# Patient Record
Sex: Female | Born: 1950
Health system: Southern US, Community
[De-identification: ages and names within clinical notes are randomized; demographics above are authoritative.]

## PROBLEM LIST (undated history)

## (undated) DIAGNOSIS — Z9889 Other specified postprocedural states: Secondary | ICD-10-CM

## (undated) DIAGNOSIS — F329 Major depressive disorder, single episode, unspecified: Secondary | ICD-10-CM

## (undated) DIAGNOSIS — F419 Anxiety disorder, unspecified: Secondary | ICD-10-CM

## (undated) DIAGNOSIS — M329 Systemic lupus erythematosus, unspecified: Secondary | ICD-10-CM

## (undated) DIAGNOSIS — K8020 Calculus of gallbladder without cholecystitis without obstruction: Secondary | ICD-10-CM

## (undated) DIAGNOSIS — T8859XA Other complications of anesthesia, initial encounter: Secondary | ICD-10-CM

## (undated) DIAGNOSIS — R112 Nausea with vomiting, unspecified: Secondary | ICD-10-CM

## (undated) DIAGNOSIS — T4145XA Adverse effect of unspecified anesthetic, initial encounter: Secondary | ICD-10-CM

## (undated) DIAGNOSIS — K219 Gastro-esophageal reflux disease without esophagitis: Secondary | ICD-10-CM

## (undated) DIAGNOSIS — IMO0002 Reserved for concepts with insufficient information to code with codable children: Secondary | ICD-10-CM

## (undated) DIAGNOSIS — M1990 Unspecified osteoarthritis, unspecified site: Secondary | ICD-10-CM

## (undated) DIAGNOSIS — D682 Hereditary deficiency of other clotting factors: Secondary | ICD-10-CM

## (undated) DIAGNOSIS — I10 Essential (primary) hypertension: Secondary | ICD-10-CM

## (undated) DIAGNOSIS — F32A Depression, unspecified: Secondary | ICD-10-CM

## (undated) DIAGNOSIS — I2699 Other pulmonary embolism without acute cor pulmonale: Secondary | ICD-10-CM

## (undated) DIAGNOSIS — C4922 Malignant neoplasm of connective and soft tissue of left lower limb, including hip: Secondary | ICD-10-CM

## (undated) HISTORY — DX: Hereditary deficiency of other clotting factors: D68.2

## (undated) HISTORY — DX: Other pulmonary embolism without acute cor pulmonale: I26.99

---
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
## 1982-11-16 HISTORY — PX: APPENDECTOMY: SHX54

## 1990-11-16 HISTORY — PX: CHOLECYSTECTOMY: SHX55

## 1999-05-14 ENCOUNTER — Other Ambulatory Visit: Admission: RE | Admit: 1999-05-14 | Discharge: 1999-05-14 | Payer: Self-pay | Admitting: *Deleted

## 2001-01-27 ENCOUNTER — Other Ambulatory Visit: Admission: RE | Admit: 2001-01-27 | Discharge: 2001-01-27 | Payer: Self-pay | Admitting: *Deleted

## 2001-04-13 ENCOUNTER — Encounter (INDEPENDENT_AMBULATORY_CARE_PROVIDER_SITE_OTHER): Payer: Self-pay

## 2001-04-13 ENCOUNTER — Observation Stay (HOSPITAL_COMMUNITY): Admission: RE | Admit: 2001-04-13 | Discharge: 2001-04-14 | Payer: Self-pay | Admitting: Obstetrics and Gynecology

## 2001-07-06 ENCOUNTER — Inpatient Hospital Stay (HOSPITAL_COMMUNITY): Admission: RE | Admit: 2001-07-06 | Discharge: 2001-07-08 | Payer: Self-pay | Admitting: Obstetrics and Gynecology

## 2001-07-06 ENCOUNTER — Encounter (INDEPENDENT_AMBULATORY_CARE_PROVIDER_SITE_OTHER): Payer: Self-pay

## 2001-07-06 HISTORY — PX: TOTAL ABDOMINAL HYSTERECTOMY: SHX209

## 2003-11-28 ENCOUNTER — Other Ambulatory Visit: Admission: RE | Admit: 2003-11-28 | Discharge: 2003-11-28 | Payer: Self-pay | Admitting: Obstetrics and Gynecology

## 2004-12-16 ENCOUNTER — Observation Stay (HOSPITAL_COMMUNITY): Admission: EM | Admit: 2004-12-16 | Discharge: 2004-12-17 | Payer: Self-pay | Admitting: Emergency Medicine

## 2005-07-09 ENCOUNTER — Encounter: Admission: RE | Admit: 2005-07-09 | Discharge: 2005-07-09 | Payer: Self-pay | Admitting: Family Medicine

## 2005-07-22 ENCOUNTER — Encounter: Admission: RE | Admit: 2005-07-22 | Discharge: 2005-07-22 | Payer: Self-pay | Admitting: Family Medicine

## 2005-09-30 ENCOUNTER — Encounter: Admission: RE | Admit: 2005-09-30 | Discharge: 2005-09-30 | Payer: Self-pay | Admitting: Family Medicine

## 2008-02-17 ENCOUNTER — Ambulatory Visit (HOSPITAL_COMMUNITY): Admission: RE | Admit: 2008-02-17 | Discharge: 2008-02-17 | Payer: Self-pay | Admitting: Family Medicine

## 2010-08-03 ENCOUNTER — Emergency Department (HOSPITAL_COMMUNITY): Admission: EM | Admit: 2010-08-03 | Discharge: 2010-08-04 | Payer: Self-pay | Admitting: Emergency Medicine

## 2010-08-04 ENCOUNTER — Encounter: Payer: Self-pay | Admitting: Internal Medicine

## 2010-08-05 ENCOUNTER — Ambulatory Visit: Payer: Self-pay | Admitting: Internal Medicine

## 2010-08-05 ENCOUNTER — Inpatient Hospital Stay (HOSPITAL_COMMUNITY): Admission: AD | Admit: 2010-08-05 | Discharge: 2010-08-07 | Payer: Self-pay | Admitting: Internal Medicine

## 2010-08-05 DIAGNOSIS — R042 Hemoptysis: Secondary | ICD-10-CM | POA: Insufficient documentation

## 2010-08-05 DIAGNOSIS — J4489 Other specified chronic obstructive pulmonary disease: Secondary | ICD-10-CM | POA: Insufficient documentation

## 2010-08-05 DIAGNOSIS — D682 Hereditary deficiency of other clotting factors: Secondary | ICD-10-CM | POA: Insufficient documentation

## 2010-08-05 DIAGNOSIS — J449 Chronic obstructive pulmonary disease, unspecified: Secondary | ICD-10-CM | POA: Insufficient documentation

## 2010-08-05 DIAGNOSIS — I2699 Other pulmonary embolism without acute cor pulmonale: Secondary | ICD-10-CM | POA: Insufficient documentation

## 2010-08-06 ENCOUNTER — Encounter: Payer: Self-pay | Admitting: Internal Medicine

## 2010-08-20 ENCOUNTER — Ambulatory Visit: Payer: Self-pay | Admitting: Internal Medicine

## 2010-08-20 DIAGNOSIS — F17200 Nicotine dependence, unspecified, uncomplicated: Secondary | ICD-10-CM | POA: Insufficient documentation

## 2010-12-16 NOTE — Assessment & Plan Note (Signed)
Summary: Hx and Physical Exam New onset hemoptysis   Visit Type:  Initial Consult Copy to:  Dr. Radford Pax Primary Provider/Referring Provider:  Dr. Cam Hai  CC:  Hemoptysis.  History of Present Illness: 61 yowf quit smoking Sept 18th with h/o PE age 60  while on BCP  took coumadin for a year with onset of hemoptysis.  August 05, 2010  1st pulmonary office eval cc cough x years worse in am typically clears p 15-20 min but no sob and no prev hemoptysis with no recent flare.   Abuptly on 9/18 developed cough 30 min p lying down coughed up a half a cup of blood in setting of bad sinus congestion x 2 weeks self rx with clariton went [facility] with cxr (ok) and labs ok.  Then 9/19 am started again this time blood pouring out nose but only while coughing.  Only sob when coughing.   Current Medications (verified): 1)  Prilosec Otc 20 Mg Tbec (Omeprazole Magnesium) .Marland Kitchen.. 1 Every Am 2)  Claritin-D 24 Hour 10-240 Mg Xr24h-Tab (Loratadine-Pseudoephedrine) .Marland Kitchen.. 1 Once Daily As Needed  Allergies (verified): 1)  ! 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  Smokes 1 1/2 ppd. No ETOH  Review of Systems       The patient complains of shortness of breath with activity, productive cough, coughing up blood, acid heartburn, indigestion, loss of appetite, nasal congestion/difficulty breathing through nose, sneezing, and joint stiffness or pain.  The patient denies shortness of breath at rest, non-productive cough, chest pain, irregular heartbeats, weight change, abdominal pain, difficulty swallowing, sore throat,  tooth/dental problems, headaches, itching, ear ache, anxiety, depression, hand/feet swelling, rash, change in color of mucus, and fever.    Vital Signs:  Patient profile:   60 year old female Height:      64 inches Weight:      178 pounds BMI:     30.66 O2 Sat:      99 % on Room air Temp:     98.1 degrees F oral Pulse rate:   107 / minute BP sitting:   126 / 60  (left arm)  Vitals Entered By: Vernie Murders (August 05, 2010 9:22 AM)  O2 Flow:  Room air   Physical Exam  Additional Exam:  amb wf nad wt 178 August 05, 2010 HEENT mild turbinate edema with crusting, scabs left turbinates but no active  bleeding. Oropharynx no thrush or excess pnd or cobblestoning.  No JVD or cervical adenopathy. Mild accessory muscle hypertrophy. Trachea midline, nl thryroid. Chest was hyperinflated by percussion with diminished breath sounds and moderate increased exp time without wheeze. Hoover sign positive at mid inspiration. Regular rate and rhythm without murmur gallop or rub or increase P2 or edema.  Abd: no hsm, nl excursion. Ext warm without cyanosis or clubbing.       Sodium (NA)                              142               135-145  mEq/L  Potassium (K)                            3.5               3.5-5.1          mEq/L  Chloride                                 107               96-112           mEq/L  CO2                                      28                19-32            mEq/L  Glucose                                  109        h      70-99            mg/dL  BUN                                      9                 6-23             mg/dL  Creatinine                               0.93              0.4-1.2          mg/dL  GFR, Est Non African American            >60               >60              mL/min  GFR, Est African American                >60               >60              mL/min    Oversized comment, see footnote  1  Calcium                                  9.0                8.4-10.5         mg/dL   CXR  Procedure date:  08/03/2010  Findings:      Mild chronic bronchitis changes only  Impression & Recommendations:  Problem # 1:  HEMOPTYSIS UNSPECIFIED (ICD-786.30)  ? origin needs expedient w/u  last aspirn was 9/16 am and strongly doubt pulmonary emboli on basis of absence    rec admit,  scan/ fob tomorrow  Discussed in detail all the  indications, usual  risks and alternatives  relative to the benefits with patient who agrees to proceed with bronchoscopy.   Problem # 2:  FACTOR V DEFICIENCY (ICD-286.3) Pos risk pulmonary emboli but this was while on BCPs   Problem # 3:  COPD UNSPECIFIED (ICD-496) Mild CB by hx with ? element of AB/ emphysema.  Will need pft's f/u  Medications Added to Medication List This Visit: 1)  Prilosec Otc 20 Mg Tbec (Omeprazole magnesium) .Marland Kitchen.. 1 every am 2)  Claritin-d 24 Hour 10-240 Mg Xr24h-tab (Loratadine-pseudoephedrine) .Marland Kitchen.. 1 once daily as needed  Other Orders: No Charge Patient Arrived (NCPA0) (NCPA0)

## 2010-12-16 NOTE — Assessment & Plan Note (Signed)
Summary: Pulmonary/ f/u hemoptysis   Copy to:  Dr. Radford Pax Primary Provider/Referring Provider:  Dr. Cam Hai  CC:  Post hospital follouwp.  Pt states that she feels well and denies any complaints.  No more hemoptysis.  Still smoking- down to less than 1 ppd..  History of Present Illness: 57 yowf quit smoking Sept 18th with h/o PE age 60  while on BCP  took coumadin for a year with  new onset of hemoptysis in Sept 2011  August 05, 2010  1st pulmonary office eval cc cough x years worse in am typically clears p 15-20 min but no sob and no prev hemoptysis with no recent flare.   Abuptly on 9/18 developed cough 30 min p lying down coughed up a half a cup of blood in setting of bad sinus congestion x 2 weeks self rx with clariton went [facility] with cxr (ok) and labs ok.  Then 9/19 am started again this time blood pouring out nose but only while coughing.  Only sob when coughing.  Ct chest, neck and sinus all neg and fob retained bloody mucus plus RML/ RLL  August 20, 2010 Post hospital follouwp.  Pt states that she feels well and denies any complaints.  No more hemoptysis.  Still smoking- down to less than 1 ppd. no sob.  Pt denies any significant sore throat, dysphagia, itching, sneezing,  nasal congestion or excess secretions,  fever, chills, sweats, unintended wt loss, pleuritic or exertional cp, hempoptysis, change in activity tolerance  orthopnea pnd or leg swelling   Current Medications (verified): 1)  Prilosec Otc 20 Mg Tbec (Omeprazole Magnesium) .Marland Kitchen.. 1 Every Am 2)  Claritin-D 24 Hour 10-240 Mg Xr24h-Tab (Loratadine-Pseudoephedrine) .Marland Kitchen.. 1 Once Daily As Needed  Allergies (verified): 1)  ! Pcn  Past History:  Past Medical History: HEMOPTYSIS UNSPECIFIED (ICD-786.30)     - new onset 08/04/10 > admit with  neg ct chest, neck and sinus, neg FOB  FACTOR V DEFICIENCY (ICD-286.3) Hx of PULMONARY EMBOLISM (ICD-415.19)  Family History: CVA father > F V Leiden  Pacemaker mother HBP  mother Brother and sister healthy No cancer  Vital Signs:  Patient profile:   60 year old female Weight:      183.13 pounds O2 Sat:      98 % on Room air Temp:     97.7 degrees F oral Pulse rate:   89 / minute BP sitting:   112 / 68  (left arm)  Vitals Entered By: Vernie Murders (August 20, 2010 2:50 PM)  O2 Flow:  Room air   Impression & Recommendations:  Problem # 1:  HEMOPTYSIS UNSPECIFIED (ICD-786.30)  resolved with no specific dx so likely related to smoker's bronchitis  Orders: Est. Patient Level III ([address])  Problem # 2:  SMOKER (ICD-305.1)  Her updated medication list for this problem includes:    Chantix Starting Month Pak 0.5 Mg X 11 & 1 Mg X 42 Misc (Varenicline tartrate) .Marland Kitchen... Take as directed---refills are to be for the continuing pak  Discussed,  considering committing to quit.  Did have luck with chantix previously so ok to restart.  Orders: Est. Patient Level III (60454)  Medications Added to Medication List This Visit: 1)  Chantix Starting Month Pak 0.5 Mg X 11 & 1 Mg X 42 Misc (Varenicline tartrate) .... Take as directed---refills are to be for the continuing pak  Patient Instructions: 1)  Return to office in 3 months, sooner if needed for cxr and  pft's but call sooner if any more coughing up blood 2)  In meantime make every effort to stop smoking before it stops you Prescriptions: CHANTIX STARTING MONTH PAK 0.5 MG X 11 & 1 MG X 42  MISC (VARENICLINE TARTRATE) Take as directed---Refills are to be for the continuing pak  #1 x 0   Entered and Authorized by:   Nyoka Cowden MD   Signed by:   Nyoka Cowden MD on 08/20/2010   Method used:   Electronically to        [facility]  [phone]* (retail)       [address]       [place], [place]  [address]       Ph: [phone] or [phone]       Fax: [phone]   RxID:   [id number]

## 2011-01-29 LAB — CBC
HCT: 39.8 % (ref 36.0–46.0)
HCT: 42.3 % (ref 36.0–46.0)
Hemoglobin: 13.8 g/dL (ref 12.0–15.0)
Hemoglobin: 14.7 g/dL (ref 12.0–15.0)
MCH: 31.5 pg (ref 26.0–34.0)
MCH: 31.7 pg (ref 26.0–34.0)
MCHC: 34.7 g/dL (ref 30.0–36.0)
MCHC: 34.7 g/dL (ref 30.0–36.0)
MCV: 90.8 fL (ref 78.0–100.0)
MCV: 91.4 fL (ref 78.0–100.0)
Platelets: 322 10*3/uL (ref 150–400)
Platelets: 372 10*3/uL (ref 150–400)
RBC: 4.39 MIL/uL (ref 3.87–5.11)
RBC: 4.64 MIL/uL (ref 3.87–5.11)
RDW: 13.1 % (ref 11.5–15.5)
RDW: 13.1 % (ref 11.5–15.5)
WBC: 8.6 10*3/uL (ref 4.0–10.5)
WBC: 9.3 10*3/uL (ref 4.0–10.5)

## 2011-01-29 LAB — AFB CULTURE WITH SMEAR (NOT AT ARMC): Acid Fast Smear: NONE SEEN

## 2011-01-29 LAB — DIFFERENTIAL
Basophils Absolute: 0.1 10*3/uL (ref 0.0–0.1)
Basophils Relative: 1 % (ref 0–1)
Eosinophils Absolute: 0.1 10*3/uL (ref 0.0–0.7)
Eosinophils Relative: 1 % (ref 0–5)
Lymphocytes Relative: 44 % (ref 12–46)
Lymphs Abs: 3.7 10*3/uL (ref 0.7–4.0)
Monocytes Absolute: 0.5 10*3/uL (ref 0.1–1.0)
Monocytes Relative: 6 % (ref 3–12)
Neutro Abs: 4.1 10*3/uL (ref 1.7–7.7)
Neutrophils Relative %: 48 % (ref 43–77)

## 2011-01-29 LAB — BASIC METABOLIC PANEL
BUN: 9 mg/dL (ref 6–23)
CO2: 28 mEq/L (ref 19–32)
Calcium: 9 mg/dL (ref 8.4–10.5)
Chloride: 107 mEq/L (ref 96–112)
Creatinine, Ser: 0.93 mg/dL (ref 0.4–1.2)
GFR calc Af Amer: >60 mL/min (ref >60)
GFR calc non Af Amer: >60 mL/min (ref >60)
Glucose, Bld: 109 mg/dL — ABNORMAL HIGH (ref 70–99)
Potassium: 3.5 mEq/L (ref 3.5–5.1)
Sodium: 142 mEq/L (ref 135–145)

## 2011-01-29 LAB — TYPE AND SCREEN
ABO/RH(D): A POS
Antibody Screen: NEGATIVE

## 2011-01-29 LAB — PROTIME-INR
INR: 0.88 (ref 0.00–1.49)
Prothrombin Time: 12.1 seconds (ref 11.6–15.2)

## 2011-01-29 LAB — ABO/RH: ABO/RH(D): A POS

## 2011-01-29 LAB — SEDIMENTATION RATE: Sed Rate: 22 mm/hr (ref 0–22)

## 2011-02-04 ENCOUNTER — Other Ambulatory Visit: Payer: Self-pay | Admitting: Internal Medicine

## 2011-02-06 NOTE — Telephone Encounter (Signed)
Records indicate needs ov with cxr then we can discuss the chantix

## 2011-02-12 ENCOUNTER — Other Ambulatory Visit: Payer: Self-pay | Admitting: *Deleted

## 2011-02-12 NOTE — Telephone Encounter (Signed)
LMTCB - need to get her on the sched.

## 2011-02-12 NOTE — Telephone Encounter (Signed)
Rax received from the pharmacy requesting a refill for Chantix on this patient. RX written 12/12/2010. Last filled 01/08/2011. Patient last seen on 10/5/201 and has no pending appts. Pls advise if okay to send refill.

## 2011-02-12 NOTE — Telephone Encounter (Signed)
Ok but needs ov here before the month runs out to regroup, or she can see her primary for this

## 2011-03-16 NOTE — Telephone Encounter (Signed)
lmomtcb x1 

## 2011-03-19 ENCOUNTER — Encounter: Payer: Self-pay | Admitting: *Deleted

## 2011-03-19 NOTE — Telephone Encounter (Signed)
Lmomtcb. Will send letter asking that she call our office for an appointment.

## 2011-04-02 ENCOUNTER — Encounter: Payer: Self-pay | Admitting: Internal Medicine

## 2011-04-03 NOTE — H&P (Signed)
[facility]  Patient:    Brittany Jordan, Brittany Jordan Visit Number: [id number] MRN: [id number]          Service Type: GYN Location: [id number] Attending Physician:  Miguel Aschoff Dictated by:   Miguel Aschoff, M.D. Adm. Date:  07/06/2001                           History and Physical  ADMISSION DIAGNOSIS:          Chronic pelvic pain, dyspareunia.  HISTORY:                      The patient is a 60 year old white female gravida 0 para 0 with a long history of pelvic pain and dyspareunia that has been refractory to medical therapy.  The patient ultimately underwent a laparoscopy and a left salpingo-oophorectomy in May 2002 for a cystic mass that proved to be a theca lutein cyst.  Following this procedure the patient continued to have dyspareunia and pelvic pain, and at this point on pelvic exam the pain is duplicated by the motion of the cervix and uterus.  It was felt that at this point the only measures left to resolve the pain would be a hysterectomy and the patient was desirous to have this problem corrected, and is now being admitted to undergo total abdominal hysterectomy and right salpingo-oophorectomy.  Patients menstrual cycles are still regular.  She denies any history of dysuria, frequency, or urgency.  There is no history of nausea, vomiting, diarrhea, melena, or hematochezia.  PAST MEDICAL HISTORY:         Positive for history of factor V Leiden. Patient had a pulmonary embolus in 1976.  Ear surgery in 1980.  Appendectomy in 1970.  ALLERGIC HISTORY:             Positive for allergies to PENICILLIN.  The patient has nausea and vomiting when taking CODEINE.  REVIEW OF SYSTEMS:            HEENT:  Negative.  CARDIORESPIRATORY:  Negative for shortness of breath, cough or hemoptysis.  GI:  Negative - see present illness.  GU:  Negative.  GYN:  See present illness.  NEUROMUSCULAR: Negative.  PHYSICAL EXAMINATION:  GENERAL:                      The patient  is a well-developed, well-nourished, thin white female in no acute distress.  VITAL SIGNS:                  Temperature 97.3, pulse 89, respirations 16, blood pressure 96/60.  HEENT:                        Head is normocephalic and atraumatic.  Eyes show pupils to be reactive to light and accommodation.  Sclerae are white, conjunctivae are pink.  Extraocular muscles have full range of motion.  Nose patent without gross lesions.  Tongue is moist and midline and throat is clear.  NECK:                         Supple.  There is no adenopathy or jugular venous distention noted.  No thyromegaly is noted.  HEART:                        Reveals a regular rhythm  with no murmur or gallop.  BREASTS:                      Nontender.  No masses are found.  ABDOMEN:                      Soft, nontender.  There is no evidence of enlargement of the liver, kidneys, or spleen.  Bowel sounds are active.  There is a well-healed incision in the right lower quadrant and well-healed laparoscopy incisions in the lower abdomen.  BACK:                         Reveal no CVA tenderness or deformity.  PELVIC:                       Reveals normal external genitalia, normal Bartholins and Skenes glands.  Normal urethra.  Vaginal vault is without gross lesions.  Cervix is without gross lesion.  Uterus anterior, normal size, but is tender to touch and motion duplicating patients dyspareunia.  The adnexa reveal no definite masses.  Rectovaginal exam is confirmatory.  EXTREMITIES:                  Reveal no cyanosis, clubbing, or edema.  SKIN:                         Without gross lesion.  NEUROLOGIC:                   Intact.  IMPRESSION:                   Chronic pelvic pain, dyspareunia.  PLAN:                         The patient to undergo total abdominal hysterectomy and right salpingo-oophorectomy. Dictated by:   Miguel Aschoff, M.D. Attending Physician:  Miguel Aschoff DD:  07/06/01 TD:  07/06/01 Job:  [id number]

## 2011-04-03 NOTE — Discharge Summary (Signed)
[facility]  Patient:    Brittany Jordan, Brittany Jordan                          MRN: [id number] Adm. Date:  [id number] Disc. Date: [id number] Attending:  Miguel Aschoff                           Discharge Summary  ADMISSION DIAGNOSES:          1. Chronic left lower quadrant pain.                               2. Dyspareunia.  FINAL DIAGNOSES:              1. Pelvic endometriosis.                               2. Theca-lutein cyst of left ovary.  OPERATIONS AND PROCEDURES:    1. Laparoscopy with left salpingo-oophorectomy.                               2. Laser uterosacral nerve ablation.  BRIEF HISTORY:                The patient is a 60 year old white female with a prior history of endometriosis and persistent left lower quadrant pain and dyspareunia. On ultrasound she was noted to have a complex left adnexal mass. Because of the persistent symptomatology associated with this mass, she has requested that a definitive procedure be carried out to establish the etiology of the pain and correct it. She was therefore admitted to the hospital to undergo laparoscopy and laparotomy, if indicated.  HOSPITAL COURSE:              Preoperative studies were obtained. This included an admission hemoglobin of 14.9, hematocrit 44, chemistry profile was normal, urinalysis was negative. Under general anesthesia, diagnostic laparoscopy was carried out. A complex cyst of the left ovary was identified and laparoscopic left salpingo-oophorectomy was carried out without difficulty. In addition, in view of her prior history of endometriosis, laser uterosacral nerve ablation was also carried out. The patient was observed in the hospital for one day and was in satisfactory condition and felt stable enough to be discharged home in the morning of Apr 14, 2001.  DISCHARGE MEDICATIONS:        1. Tylox, one every three hours as needed for                                  pain.          2. Phenergan 25 mg p.o. q.4h. for nausea.  DISCHARGE FOLLOWUP:           She will be seen back in four weeks for followup examination.  DISCHARGE INSTRUCTIONS:       She was instructed to place nothing in the vagina and to call if there are any problems such as fever, pain, or heavy bleeding.  DD:  05/04/01 TD:  05/04/01 Job: [id number]

## 2011-04-03 NOTE — Op Note (Signed)
[facility]  Patient:    Brittany Jordan, Brittany Jordan Visit Number: [id number] MRN: [id number]          Service Type: GYN Location: 9300 [id number] Attending Physician:  Miguel Aschoff Proc. Date: 07/06/01 Adm. Date:  07/06/2001                             Operative Report  PREOPERATIVE DIAGNOSIS:       Chronic pelvic pain with dyspareunia.  POSTOPERATIVE DIAGNOSIS:      Chronic pelvic pain with dyspareunia.  OPERATION:                    1. Total abdominal hysterectomy.                               2. Right salpingo-oophorectomy.  SURGEON:                      Miguel Aschoff, M.D.  ASSISTANT:                    Luvenia Redden, M.D.  ANESTHESIA:                   General.  COMPLICATIONS:                None.  JUSTIFICATION:                The patient is a 60 year old white female with a long history of persistent chronic pelvic pain which was mainly on the left side but did not respond to left salpingo-oophorectomy.  She presents now to undergo definitive surgery with total abdominal hysterectomy and right salpingo-oophorectomy in effort to correct the pelvic pain and dyspareunia. Risks and benefits have been discussed with the patient.  Informed consent has been obtained.  DESCRIPTION OF PROCEDURE:     The patient was taken to the operating room, placed in the sitting position, and general anesthesia was administered without difficulty.  She was then placed in a modified lithotomy position and prepped and draped in the usual sterile fashion.  Pfannenstiel incision was made, extended down through the subcutaneous tissue with bleeding points being clamped and coagulated as they were encountered.  The fascia was then identified and incised transversely and separated from the underlying rectus muscles.  The rectus muscles were divided in the midline, and the peritoneum was then identified and entered carefully, avoiding underlying structures. The peritoneal incision  was extended under direct visualization.  At this point, a self-retaining retractor was placed through the wound, the viscera packed out of the pelvis.  Inspection revealed the uterus to be actually small in size but with bulbous cervix and thickened uterosacral ligaments.  The left tube and ovary were noted to be absent.  There were adhesions of the rectosigmoid on the left cornual portion of the ureters.  The right ovary was within normal limits.  There were some adhesions holding the right tube to the pelvic sidewall.  These adhesions were taken down without difficulty.  At this point, the round ligaments were identified, clamped, cut, and suture ligated using suture ligatures of 0 Vicryl.  The bladder flap was then created anteriorly.  Perforation was then made below the utero-ovarian ligament.  This ligament was then clamped, cut, and suture ligated using suture ligatures of O Vicryl.  The broad ligament  was skeletonized.  The uterine vessels were identified, clamped with curve Heaney clamps, and all pedicles were cut and suture ligated using suture ligatures of 0 Vicryl.  Using straight Heaney clamps, the paracervical fascia was clamped bilaterally, cut, and suture ligated using suture ligatures of 0 Vicryl.  Then the cardinal ligaments and uterosacral ligaments were clamped, cut, and suture ligated in a similar fashion.  It was then possible to place curved Heaney clamps across the reflection of the vagina to the cervix, and the specimen was then cut free. The angles of the vaginal cuff were then transfixed using figure-of-eight sutures of 0 Vicryl with incorporation of the uterosacral ligaments for support.  The remainder of the vaginal cuff was closed using interrupted 0 Vicryl sutures.  Attention was then directed to the right ovary.  A perforation was then made in the peritoneum between the infundibulopelvic ligament and round ligament. The ureter was then identified.  With the  ureter being out of the field, the infundibulopelvic ligament was clamped, cut, and doubly ligated using two ligatures of 0 Vicryl.  Using a Heaney clamp, the residual pedicle holding the ovary and the tube was clamped, cut, and this pedicle was suture ligated using suture ligature of 0 Vicryl.  The pelvic floor was then reperitonealized using running continuous 2-0 Vicryl suture.  The abdomen was then irrigated with warm saline.  Inspection was made for hemostasis.  Hemostasis appeared to be excellent, and then the abdomen was closed.  Instrument counts were taken and found to be correct as well as lap counts.  The parietal peritoneum was closed using running continuous 0 Vicryl suture.  The rectus muscles were reapproximated using running continuous 0 Vicryl suture.  The fascia was then closed using two sutures of 0 Vicryl, each starting at the lateral fascial angles and meeting at the midline.  Subcutaneous tissue and skin were closed using staples.  The estimated blood loss was approximately 100 cc.  The patient tolerated the procedure well and went to the recovery room in satisfactory condition. Attending Physician:  Miguel Aschoff DD:  07/06/01 TD:  07/06/01 Job: [id number]

## 2011-04-03 NOTE — Discharge Summary (Signed)
NAME:  Brittany Jordan, Brittany Jordan                 ACCOUNT NO.:  [id number]   MEDICAL RECORD NO.:  [id number]          PATIENT TYPE:  INP   LOCATION:  3703                         FACILITY:  [facility]   PHYSICIAN:  Corinna L. Lendell Caprice, MDDATE OF BIRTH:  12/11/1950   DATE OF ADMISSION:  12/16/2004  DATE OF DISCHARGE:  12/17/2004                                 DISCHARGE SUMMARY   DIAGNOSES:  1.  Chest pain, possibly gastroesophageal reflux disease.  2. Myocardial      infarction ruled out.  3. Pulmonary embolus ruled out.  4. Discoid      lupus.  5. Tobacco abuse, counseled against.  6. Restless leg syndrome.      7. Anxiety disorder.   DISCHARGE MEDICATIONS:  1.  Aspirin 81 mg a day.  She is to continue her other outpatient      medications.  2. Effexor XR 150 mg daily.  3. Plaquenil 200 mg p.o.      b.i.d.  4. Flexeril 5 mg p.o. q.h.s.  5. Estrogen patch twice weekly.      6. Aciphex daily.   PROCEDURE:  None.   CONSULTATIONS:  None.   ACTIVITY:  Ad lib.   CONDITION:  Stable.   PERTINENT LABORATORY:  CBC, BMET and serial cardiac enzymes negative.   SPECIAL STUDIES AND RADIOLOGY:  EKG showed normal sinus rhythm.  Cannot rule  out age indeterminate anteroseptal infarct.  Cat scan of the chest showed no  evidence of pulmonary embolus.  It did show emphysema and chronic  bronchitis.   HOSPITAL COURSE:  Brittany Jordan is a 60 year old white female who presented with  several day history of chest pressure.  It also was accompanied by  occasional sharp chest pain.  It did not change with inspiration or  movement.  It did, however, worsen with exertion.  The patient had been seen  the day prior and started on Aciphex for this problem, but the pain did not  go away and the patient presented to the emergency room.  She had a normal  exam and normal labs, and EKG that showed no acute changes.  The patient has  a history of factor V Leiden and previous pulmonary embolus and so a cat  scan of the chest was  done.  This ruled out pulmonary embolism.  The patient  had thought that this may be somewhat stress related as she is under a lot  of stress from her job and is currently looking for another job because of  this reason.  The patient was admitted to telemetry and ruled out for  myocardial infarction.  She received aspirin.  She received Toradol,  nitroglycerin sublingually and a GI cocktail; none of which helped  tremendously.  However, the day of discharge, she had no further chest pain.  The Cardiolite could not be scheduled because the patient had already eaten,  however, due to the atypical nature of this pain, she can safely be  discharged to home with an outpatient stress test.  I have set this up with  [facility] and they will send  the results to Dr. __________, who can do any  further workup if needed.   Total time on the day of discharge is 40 minutes.      CLS/MEDQ  D:  12/17/2004  T:  12/17/2004  Job:  [id number]   cc:   Francisca December, M.D.  [address]  [address]  [place]  [place] [address]  Fax: [phone]

## 2011-04-03 NOTE — Op Note (Signed)
[facility]  Patient:    Brittany Jordan, Brittany Jordan                          MRN: [id number] Proc. Date: 04/13/01 Attending:  Miguel Aschoff, M.D.                           Operative Report  PREOPERATIVE DIAGNOSIS:       Chronic pelvic pain, dyspareunia.  POSTOPERATIVE DIAGNOSIS:      Pelvic endometriosis with endometriomas of left ovary.  OPERATION:                    Diagnostic laparoscopy with laparoscopic salpingo-oophorectomy.  Laser ablation of uterosacral nerves.  SURGEON:                      Miguel Aschoff, M.D.  ASSISTANT:                    Marcelle Overlie, M.D.  ANESTHESIA:                   General anesthesia.  COMPLICATIONS:                None.  ESTIMATED BLOOD LOSS:  INDICATIONS:                  The patient is a 60 year old white female with a persistent history of left lower quadrant pain and dyspareunia as well as prior history of endometriosis.  The pain had become intolerable and ultrasound examination was carried out and revealed the presence of a complex cystic mass in the left adnexa.  Because of this mass and the patients pain, she is being taken to the operating room to undergo laparoscopy and possible laparotomy with hysterectomy if indicated.  The risks and benefits of the procedure have been discussed with the patient.  DESCRIPTION OF PROCEDURE:     The patient was taken to the operating room, placed in the supine position, and general anesthesia was administered without difficulty.  She was then placed in the dorsal lithotomy position and prepped and draped in the usual sterile fashion.  The bladder was catheterized and then a Hulka tenaculum was placed through the cervix and held.  Attention was then directed to the umbilicus where a small infraumbilical incision was made, the Veress needle was then inserted, and the abdomen was insufflated with 3 liters of CO2.  Once this was done, the trocar to the laparoscope was placed followed  by the laparoscope itself.  Then under direct visualization, two additional ports were established.  The 5 mm suprapubic port and a 12 mm left lower quadrant port.  Systematic inspection of the pelvic organs showed the uterus to be anterior.  There was a small posterior 1 cm fibroid noted on the left cornual portion of the uterus.  The uterus was otherwise unremarkable. The tubes were traced out to their fimbriated ends.  The fimbria were fine and delicate.  The tubes were normal along their course.  The ovaries were inspected.  The right ovary was noted to have what appeared to be a follicular cyst present and was otherwise free of lesions and there were no adhesions noted on the right side.  On the left side, the ovary had what appeared to be two small endometriomas present approximately 2 cm in size each.  The intestinal surfaces appeared  to be unremarkable.  The cul-de-sac was inspected and there were several very small implants of endometriosis noted between the uterosacral ligaments.  At this point, bipolar cautery forceps were introduced and the utero-ovarian ligament and fallopian tube on the left side were coagulated and cut and then a pedicle was developed using the mesosalpinx and the mesoovarian.  These ligaments were coagulated and cut with scissors until it was possible to get to the base of the ovary and the infundibulopelvic ligament.  The ovary and tube were then elevated and endoloops were passed through the 12 mm port and the pedicle was doubly ligated without difficulty. The specimen was cut free, the specimen consisting of the left tube and ovary. There appeared to be excellent hemostasis on the left side and this area was irrigated and there was no evidence of any bleeding from the pedicles.  The intestinal surfaces were all maintained without difficulty.  In an effort to try to improve the patients dyspareunia, the YAG laser was used and the uterosacral ligaments were  partially transected using the YAG laser with care to avoid any injury to the ureter or any adjacent structures.  This was also done without difficulty.  Once this was done, an endocatch was placed through the 12 mm port and the specimen was placed in the bag and this was then removed through the left lower quadrant 12 mm incision.  Final inspection was made for hemostasis.  Hemostasis appeared to be excellent and with no other abnormalities being noted, the procedure was completed.  The CO2 was allowed to escape.  All instruments were removed and then the incisions of the umbilicus and the 5 mm port were closed using subcuticular 4-0 Vicryl.  The 12 mm port was closed by identifying the fascia and placing a figure-of-eight suture of 0 Vicryl through the fascia to close this defect.  Then a subcutaneous suture of 0 Vicryl was used and then Steri-Strips were applied to all of the sites.  The estimated blood loss was approximately 30 cc.  The patient tolerated the procedure well and was taken to the recovery room in satisfactory condition.  The plan is for the patient to spend the night.  She will be placed on analgesics.  The patient has a history of pulmonary embolus with factor V leiden.  She will receive prophylactic heparin therapy. DD:  04/13/01 TD:  04/13/01 Job: [id number]

## 2011-04-03 NOTE — H&P (Signed)
NAME:  Brittany Jordan, Brittany Jordan                 ACCOUNT NO.:  [id number]   MEDICAL RECORD NO.:  [id number]          PATIENT TYPE:  EMS   LOCATION:  MAJO                         FACILITY:  [facility]   PHYSICIAN:  Corinna L. Lendell Caprice, MDDATE OF BIRTH:  [birth year]   DATE OF ADMISSION:  12/16/2004  DATE OF DISCHARGE:                                HISTORY & PHYSICAL   CHIEF COMPLAINT:  Chest pain.   HPI:  Brittany Jordan is a 60 year old white female with substernal chest pain  since yesterday.  She saw Dr. Arvilla Market in the office yesterday and was told  that it might be her gastrointestinal tract.  She was given Aciphex and sent  home.  The pain initially was sharp yesterday.  This morning she woke up and  it felt worse, it feels pressure-like with periods of sharpness.  She has  some mild shortness of breath with it, she has had no cough, no nausea, no  dizziness.  The pain has been constant.  She has no history of coronary  artery disease.  She has a history of pulmonary embolus in her 23s and she  has Factor V Lyden.   PAST MEDICAL HISTORY:  1.  History of pulmonary embolus with Factor V Lyden.  2.  Discoid lupus.  3.  Restless leg syndrome.  4.  Possible gastroesophageal reflux disease.  5.  Anxiety.  6.  Tobacco use.   MEDICATIONS:  1.  Lorapatch 0.025 q.Monday and Thursday.  2.  Flexeril 5 mg p.o. q.h.s..  3.  Effexor XR 150 mg p.o. daily.  4.  Plaquenil 200 mg p.o. b.i.d.  5.  Aciphex was started yesterday 20 mg daily.   ALLERGIES:  She is ALLERGIC TO PENICILLIN WHICH CAUSES A RASH.   FAMILY HISTORY:  Her father died of a stroke, he had rheumatoid arthritis.  Her mother is alive and healthy.   SOCIAL HISTORY:  Patient is married.  She describes her job as extremely  stressful.  She smokes a pack of cigarettes a day.  She does not drink or  use drugs.   REVIEW OF SYSTEMS:  As above, otherwise negative.   PHYSICAL EXAMINATION:  Temperature is 96.9, blood pressure 128/89, pulse  111,  respiratory rate 20, oxygen saturation 99% on room air.  In general,  patient is well-nourished, well-developed, in no acute distress.  HEENT:  Normocephalic, atraumatic.  Pupils equal, round, reactive to light.  Sclera nonicteric.  Moist mucous membranes.  NECK:  Supple, no lymphadenopathy, no carotid bruits, no thyromegaly.  LUNGS:  Clear to auscultation bilaterally without wheezes, rhonchi or rales.  CARDIOVASCULAR:  Regular rate, rhythm without murmur, gallops, rubs.  ABDOMEN:  Normal bowel sounds, soft, nontender, nondistended.  GU AND RECTAL:  Deferred.  EXTREMITIES:  No clubbing, cyanosis or edema.  No calf tenderness.  Homan's  sign negative.  SKIN:  No rash.  PSYCHIATRIC:  Normal affect.   LABS:  Her first set of point of care enzymes is negative.  Basic metabolic  panel unremarkable.   EKG shows normal sinus rhythm, cannot rule out age indeterminate  anteroseptal  infarct.  Chest x-ray negative.   ASSESSMENT AND PLAN:  1.  Chest pain.  Within the differential, of course, is pulmonary embolus as      patient has a history of hypercoagulability and previous pulmonary      embolus.  I will get a CT of the chest to evaluate this further.      Cardiac source is less likely.  She has had chest pain continuously      since yesterday and if this were cardiac she should have bumped her      enzymes.  I will, however, get two more sets of cardiac enzymes and try      a sublingual nitroglycerin.  2.  Possible gastroesophageal reflux disease.  I will continue proton pump      inhibitor.  3.  Discoid lupus.  4.  Anxiety.  5.  Restless leg syndrome.  6.  Tobacco abuse, counseled against.      CLS/MEDQ  D:  12/16/2004  T:  12/16/2004  Job:  [id number]   cc:   Donia Guiles, M.D.  [address] [place]  [place] [address]  Fax: [phone]

## 2011-04-03 NOTE — Discharge Summary (Signed)
[facility]  Patient:    Brittany Jordan, DRIVER Visit Number: [id number] MRN: [id number]          Service Type: GYN Location: [id number] Attending Physician:  Miguel Aschoff Dictated by:   Miguel Aschoff, M.D. Admit Date:  07/06/2001 Discharge Date: 07/08/2001                             Discharge Summary  ADMISSION DIAGNOSIS:          Chronic pelvic pain.  FINAL DIAGNOSES:              1. Myoma.                               2. Endosalpingiosis.                               3. Pelvic adhesions.                               4. Suspected endometriosis.  OPERATIONS AND PROCEDURES:    1. Total abdominal hysterectomy.                               2. Right salpingo-oophorectomy.                               3. General anesthesia.  BRIEF HISTORY:                The patient is a 60 year old white female with a history of persistent chronic pelvic pain associated with left lower quadrant pain and severe dyspareunia. The patient underwent a prior laparoscopic oophorectomy, however, this did not result in any significant improvement of her pain, and she presents now to undergo expectant therapy via total abdominal hysterectomy and right salpingo-oophorectomy.  LABORATORY DATA:              Preoperative studies were obtained. This revealed admission hemoglobin of 15.1, white count 5700. Chemistry profile revealed slight elevation of the total protein to 8.3. The remainder of the values were normal. Urinalysis was negative. The patient was noted also to have factor V Leiden.  HOSPITAL COURSE:              Under general anesthesia on August 21, the patient underwent total abdominal hysterectomy and right salpingo-oophorectomy without difficulty. She was noted to have marked thickening of the uterosacral ligaments, again, which appeared to be consistent with endometriosis. She tolerated the surgical procedure well and an essentially uncomplicated postoperative  course. The patient was in satisfactory condition and by July 08, 2001 could be discharged home.  DISCHARGE MEDICATIONS:        1. Tylox one every three hours as needed for                                  pain.                               2. Phenergan one every six hours as needed for  nausea.                               3. Estradiol 1 mg daily.  DISCHARGE FOLLOWUP:           The patient was instructed to return to the office in four weeks for a followup examination, to do no heavy lifting, place nothing in the vagina, and to call for any problems such as fever, pain, or heavy bleeding.  FINAL PATHOLOGY REPORT:       Specimen revealed a 44 g uterus. There was an intramural leiomyoma, proliferative endometrioma, serosal fibrous adhesions, and endosalpingiosis noted as well as a benign hemorrhagic cyst in the right ovary. Dictated by:   Miguel Aschoff, M.D. Attending Physician:  Miguel Aschoff DD:  07/27/01 TD:  07/27/01 Job: [id number]

## 2011-04-08 ENCOUNTER — Encounter: Payer: Self-pay | Admitting: Internal Medicine

## 2011-04-08 ENCOUNTER — Ambulatory Visit (INDEPENDENT_AMBULATORY_CARE_PROVIDER_SITE_OTHER): Payer: BC Managed Care – PPO | Admitting: Internal Medicine

## 2011-04-08 VITALS — BP 116/78 | HR 95 | Temp 97.7°F | Ht 63.0 in | Wt 194.0 lb

## 2011-04-08 DIAGNOSIS — F17200 Nicotine dependence, unspecified, uncomplicated: Secondary | ICD-10-CM

## 2011-04-08 DIAGNOSIS — J449 Chronic obstructive pulmonary disease, unspecified: Secondary | ICD-10-CM

## 2011-04-08 DIAGNOSIS — R042 Hemoptysis: Secondary | ICD-10-CM

## 2011-04-08 DIAGNOSIS — I2699 Other pulmonary embolism without acute cor pulmonale: Secondary | ICD-10-CM

## 2011-04-08 MED ORDER — VARENICLINE TARTRATE 0.5 MG X 11 & 1 MG X 42 PO MISC: ORAL | Status: AC

## 2011-04-08 NOTE — Patient Instructions (Signed)
Stop smoking before it stops you  Chantix starter pack has been called in and you will need to see Dr Clelia Croft for refills - if you still have cravings after 3 months it's ok to continue it in whatever dose suppresses the urge to smoke   If you are satisfied with your treatment plan let your doctor know and he/she can either refill your medications or you can return here when your prescription runs out.     If in any way you are not 100% satisfied,  please tell us.  If 100% better, tell your friends!

## 2011-04-08 NOTE — Progress Notes (Signed)
Subjective:     Patient ID: Brittany Jordan, female   DOB: [birth year], 60 y.o.   MRN: [id number]  HPI  60 yowf active smoker  with h/o PE age 60 while on BCP took coumadin for a year with new onset of hemoptysis in Sept 2011   August 05, 2010 1st pulmonary office eval cc cough x years worse in am typically clears p 15-20 min but no sob and no prev hemoptysis with no recent flare. Abuptly on 9/18 developed cough 30 min p lying down coughed up a half a cup of blood in setting of bad sinus congestion x 2 weeks self rx with clariton went [facility] with cxr (ok) and labs ok. Then 9/19 am started again this time blood pouring out nose but only while coughing. Only sob when coughing. Ct chest, neck and sinus all neg and fob retained bloody mucus plus RML/ RLL   August 20, 2010 ov/ pcc feels well and denies any complaints. No more hemoptysis. Still smoking- down to less than 1 ppd. no sob.  rec stop smoking > transient success then p 3 weeks off chantix resumed   04/08/2011 ov/Brittany Jordan cc cough each am x up to an hour, Sleeping ok without nocturnal  or early am exac of resp c/o's and no problem with activity/ sob although relatively inactive.  Pt denies any significant sore throat, dysphagia, itching, sneezing,  nasal congestion or excess/ purulent secretions,  fever, chills, sweats, unintended wt loss, pleuritic or exertional cp, hempoptysis, orthopnea pnd or leg swelling.    Also denies any obvious fluctuation of symptoms with weather or environmental changes or other aggravating or alleviating factors.     Allergies   1) ! Pcn      Past Medical History:  HEMOPTYSIS UNSPECIFIED (ICD-786.30)  - new onset 08/04/10 > admit with neg ct chest, neck and sinus, neg FOB  FACTOR V DEFICIENCY (ICD-286.3)  Hx of PULMONARY EMBOLISM (ICD-415.19)   Family History:  CVA father > F V Leiden  Pacemaker mother  HBP mother  Brother and sister healthy  No cancer           Review of Systems     Objective:   Physical Exam    04/08/2011  wt amb wf nad  HEENT mild turbinate edema.  Oropharynx no thrush or excess pnd or cobblestoning.  No JVD or cervical adenopathy. Mild accessory muscle hypertrophy. Trachea midline, nl thryroid. Chest was hyperinflated by percussion with diminished breath sounds and moderate increased exp time without wheeze. Hoover sign positive at mid inspiration. Regular rate and rhythm without murmur gallop or rub or increase P2 or edema.  Abd: no hsm, nl excursion. Ext warm without cyanosis or clubbing.    Assessment:          Plan:

## 2011-04-08 NOTE — Assessment & Plan Note (Signed)
She is aware of increased risk recurrence

## 2011-04-08 NOTE — Assessment & Plan Note (Signed)
No recurrence of hemoptysis or epistaxis

## 2011-04-08 NOTE — Assessment & Plan Note (Signed)
Discussed in detail all the  indications, usual  risks and alternatives  relative to the benefits with patient who agrees to proceed with  Another course of chantix this time tapering down but necessarily off after 3 months depending on whether still having cravings

## 2011-04-08 NOTE — Assessment & Plan Note (Signed)
Minimally symptomatic and main issue is maintaining airflow at present level  I reviewed the Flethcher curve with patient that basically indicates  if you quit smoking when your best day FEV1 is still well preserved it is highly unlikely you will progress to severe disease and informed the patient there was no medication on the market that has proven to change the curve or the likelihood of progression.  Therefore stopping smoking and maintaining abstinence is the most important aspect of care, not choice of inhalers or for that matter, doctors.

## 2011-10-22 ENCOUNTER — Telehealth: Payer: Self-pay | Admitting: Oncology

## 2011-10-22 NOTE — Telephone Encounter (Signed)
Per triage nurse Brittany Jordan is concerned about venlafaxine being a tier 1 instead of a tieir 3.  I called [phone] and per rep (with Neilsa on the phone) this medication is already a tier 1 and they will contact the patient

## 2012-09-22 ENCOUNTER — Other Ambulatory Visit: Payer: Self-pay | Admitting: Physician Assistant

## 2012-09-22 ENCOUNTER — Ambulatory Visit
Admission: RE | Admit: 2012-09-22 | Discharge: 2012-09-22 | Disposition: A | Discharge status: Home / Self Care | Payer: BC Managed Care – PPO | Source: Ambulatory Visit | Attending: Physician Assistant | Admitting: Physician Assistant

## 2012-09-22 DIAGNOSIS — R05 Cough: Secondary | ICD-10-CM

## 2012-09-22 DIAGNOSIS — R059 Cough, unspecified: Secondary | ICD-10-CM

## 2014-02-22 ENCOUNTER — Other Ambulatory Visit: Payer: Self-pay

## 2014-04-10 ENCOUNTER — Other Ambulatory Visit: Payer: Self-pay | Admitting: Physician Assistant

## 2014-04-10 ENCOUNTER — Ambulatory Visit
Admission: RE | Admit: 2014-04-10 | Discharge: 2014-04-10 | Disposition: A | Discharge status: Home / Self Care | Payer: BC Managed Care – PPO | Source: Ambulatory Visit | Attending: Physician Assistant | Admitting: Physician Assistant

## 2014-04-10 DIAGNOSIS — R52 Pain, unspecified: Secondary | ICD-10-CM

## 2018-12-08 DIAGNOSIS — E669 Obesity, unspecified: Secondary | ICD-10-CM | POA: Diagnosis not present

## 2018-12-08 DIAGNOSIS — Z0000 Encounter for general adult medical examination without abnormal findings: Secondary | ICD-10-CM | POA: Diagnosis not present

## 2018-12-08 DIAGNOSIS — R030 Elevated blood-pressure reading, without diagnosis of hypertension: Secondary | ICD-10-CM | POA: Diagnosis not present

## 2018-12-08 DIAGNOSIS — Z720 Tobacco use: Secondary | ICD-10-CM | POA: Diagnosis not present

## 2018-12-08 DIAGNOSIS — K219 Gastro-esophageal reflux disease without esophagitis: Secondary | ICD-10-CM | POA: Diagnosis not present

## 2018-12-08 DIAGNOSIS — Z1159 Encounter for screening for other viral diseases: Secondary | ICD-10-CM | POA: Diagnosis not present

## 2018-12-08 DIAGNOSIS — E7800 Pure hypercholesterolemia, unspecified: Secondary | ICD-10-CM | POA: Diagnosis not present

## 2018-12-08 DIAGNOSIS — D179 Benign lipomatous neoplasm, unspecified: Secondary | ICD-10-CM | POA: Diagnosis not present

## 2018-12-08 DIAGNOSIS — M1990 Unspecified osteoarthritis, unspecified site: Secondary | ICD-10-CM | POA: Diagnosis not present

## 2018-12-08 DIAGNOSIS — R69 Illness, unspecified: Secondary | ICD-10-CM | POA: Diagnosis not present

## 2018-12-08 DIAGNOSIS — J449 Chronic obstructive pulmonary disease, unspecified: Secondary | ICD-10-CM | POA: Diagnosis not present

## 2018-12-15 DIAGNOSIS — Z720 Tobacco use: Secondary | ICD-10-CM | POA: Diagnosis not present

## 2018-12-15 DIAGNOSIS — J019 Acute sinusitis, unspecified: Secondary | ICD-10-CM | POA: Diagnosis not present

## 2019-01-02 DIAGNOSIS — Z1231 Encounter for screening mammogram for malignant neoplasm of breast: Secondary | ICD-10-CM | POA: Diagnosis not present

## 2019-01-25 ENCOUNTER — Other Ambulatory Visit: Payer: Self-pay | Admitting: Family Medicine

## 2019-01-25 DIAGNOSIS — R030 Elevated blood-pressure reading, without diagnosis of hypertension: Secondary | ICD-10-CM | POA: Diagnosis not present

## 2019-01-25 DIAGNOSIS — M7130 Other bursal cyst, unspecified site: Secondary | ICD-10-CM | POA: Diagnosis not present

## 2019-01-25 DIAGNOSIS — D179 Benign lipomatous neoplasm, unspecified: Secondary | ICD-10-CM | POA: Diagnosis not present

## 2019-01-25 DIAGNOSIS — D499 Neoplasm of unspecified behavior of unspecified site: Secondary | ICD-10-CM | POA: Diagnosis not present

## 2019-02-01 DIAGNOSIS — Z720 Tobacco use: Secondary | ICD-10-CM | POA: Diagnosis not present

## 2019-02-01 DIAGNOSIS — I10 Essential (primary) hypertension: Secondary | ICD-10-CM | POA: Diagnosis not present

## 2019-02-01 DIAGNOSIS — C4920 Malignant neoplasm of connective and soft tissue of unspecified lower limb, including hip: Secondary | ICD-10-CM | POA: Diagnosis not present

## 2019-02-06 ENCOUNTER — Ambulatory Visit: Payer: Self-pay | Admitting: Surgery

## 2019-02-06 DIAGNOSIS — C4922 Malignant neoplasm of connective and soft tissue of left lower limb, including hip: Secondary | ICD-10-CM | POA: Diagnosis not present

## 2019-02-06 NOTE — H&P (Signed)
Brittany Jordan Documented: 02/06/2019 9:50 AM Location: [facility] Patient #: [id number] DOB: 01/19/1951 Married / Language: Brittany Jordan / Race: White Female  History of Present Illness Brittany Jordan A. Brittany Bloodsworth MD; 02/06/2019 10:18 AM) Patient words: Patient sent at the request of Dr. Brigitte Jordan for left lower extremity mass. The patient had a golf ball sized mass located just above her left knee on the lateral aspect of the lower leg has been present for many years. She stated it began to get larger and therefore excision was recommended. This was done as an office procedure. The patient's pathology returned with diagnosis of possible liposarcoma. Grade was not assigned to this. The patient has done well suture office based procedure. She also has a synovial cyst on her right wrist that she has aspirated from time to time as well. She is a smoker.        Diagnosis Skin (A), left knee mass, excision PRELIMINARY DIAGNOSIS: FINDINGS CONSISTENT WITH LIPOSARCOMA, MARGINS INVOLVED, SEE DESCRIPTION Microscopic Description Sections show mature adipose tissue with scattered markedly atypical cells with enlarged and smudgy nuclear chromatin. Some of these cells are multinucleated and some have a lipoblast-like appearance. The margins are involved. Brittany Jordan. Brittany Jordan was informed of the preliminary findings by Dr. Emelia Jordan on 01/30/2019 at 1:20 p.m. and received consent to order MDM2 testing. An addendum update will follow following MDM2 testing completion. (DH:ah 01/30/19) Brittany Millet MD Dermatopathologist, Electronic Signature (Case signed 01/30/2019) Specimen Gross and Clinical Information Clinical Diagnosis Lipoma SPECIMEN(S).  The patient is a 68 year old female.   Past Surgical History Brittany Jordan, Brittany Jordan; 02/06/2019 9:50 AM) Appendectomy Colon Polyp Removal - Colonoscopy Gallbladder Surgery - Laparoscopic Hysterectomy (due to cancer) - Complete Oral Surgery  Diagnostic Studies  History Brittany Jordan, CMA; 02/06/2019 9:50 AM) Colonoscopy 1-5 years ago Mammogram within last year  Allergies Brittany Jordan, CMA; 02/06/2019 9:51 AM) Penicillins  Medication History Brittany Jordan, CMA; 02/06/2019 9:54 AM) amLODIPine Besylate (5MG  Tablet, Oral) Active. Amitriptyline HCl (25MG  Tablet, Oral) Active. Nystatin-Triamcinolone (100000-0.1UNIT/GM-% Cream, External) Active. Pravastatin Sodium (10MG  Tablet, Oral) Active. Centrum Silver (Oral) Active. Ibuprofen (200MG  Capsule, Oral) Active. Magnesium (200MG  Tablet, Oral) Active. PriLOSEC (20MG  Capsule DR, Oral) Active. Probiotic (250MG  Capsule, Oral) Active. Vitamin D2 (10 MCG(400 UNIT) Tablet, Oral) Active. Medications Reconciled  Social History Brittany Jordan, [place]; 02/06/2019 9:50 AM) Alcohol use Occasional alcohol use. No caffeine use No drug use Tobacco use Current every day smoker.  Family History Brittany Jordan, [place]; 02/06/2019 9:50 AM) Alcohol Abuse Brother. Arthritis Father, Mother, Sister. Cerebrovascular Accident Father, Mother. Hypertension Mother. Melanoma Mother.  Pregnancy / Birth History Brittany Jordan, [place]; 02/06/2019 9:50 AM) Age at menarche 74 years. Gravida 0 Para 0  Other Problems Brittany Jordan, [place]; 02/06/2019 9:50 AM) Cholelithiasis Gastroesophageal Reflux Disease High blood pressure Hypercholesterolemia Oophorectomy     Review of Systems (Brittany Miron A. Brittany Deike MD; 02/06/2019 10:23 AM) General Not Present- Appetite Loss, Chills, Fatigue, Fever, Night Sweats, Weight Gain and Weight Loss. Skin Not Present- Change in Wart/Mole, Dryness, Hives, Jaundice, New Lesions, Non-Healing Wounds, Rash and Ulcer. HEENT Present- Ringing in the Ears, Seasonal Allergies and Wears glasses/contact lenses. Not Present- Earache, Hearing Loss, Hoarseness, Nose Bleed, Oral Ulcers, Sinus Pain, Sore Throat, Visual Disturbances and Yellow Eyes. Respiratory Not Present- Bloody sputum,  Chronic Cough, Difficulty Breathing, Snoring and Wheezing. Breast Not Present- Breast Mass, Breast Pain, Nipple Discharge and Skin Changes. Cardiovascular Not Present- Chest Pain, Difficulty Breathing Lying Down, Leg Cramps, Palpitations, Rapid Heart Rate, Shortness of Breath and Swelling of  Extremities. Gastrointestinal Not Present- Abdominal Pain, Bloating, Bloody Stool, Change in Bowel Habits, Chronic diarrhea, Constipation, Difficulty Swallowing, Excessive gas, Gets full quickly at meals, Hemorrhoids, Indigestion, Nausea, Rectal Pain and Vomiting. Female Genitourinary Not Present- Frequency, Nocturia, Painful Urination, Pelvic Pain and Urgency. Musculoskeletal Not Present- Back Pain, Joint Pain, Joint Stiffness, Muscle Pain, Muscle Weakness and Swelling of Extremities. Neurological Not Present- Decreased Memory, Fainting, Headaches, Numbness, Seizures, Tingling, Tremor, Trouble walking and Weakness. Psychiatric Not Present- Anxiety, Bipolar, Change in Sleep Pattern, Depression, Fearful and Frequent crying. Endocrine Not Present- Cold Intolerance, Excessive Hunger, Hair Changes, Heat Intolerance, Hot flashes and New Diabetes. Hematology Not Present- Blood Thinners, Easy Bruising, Excessive bleeding, Gland problems, HIV and Persistent Infections. All other systems negative  Vitals Brittany Jordan CMA; 02/06/2019 9:55 AM) 02/06/2019 9:54 AM Weight: 201.38 lb Height: 63in Body Surface Area: 1.94 m Body Mass Index: 35.67 kg/m  Temp.: 97.75F(Oral)  Jordan: 111 (Regular)  BP: 160/80 (Sitting, Left Arm, Standard)      Physical Exam (Brittany Jordan A. Brittany Ormond MD; 02/06/2019 10:19 AM)  General Mental Status-Alert. General Appearance-Consistent with stated age. Hydration-Well hydrated. Voice-Normal.  Head and Neck Head-normocephalic, atraumatic with no lesions or palpable masses. Trachea-midline. Thyroid Gland Characteristics - normal size and consistency.  Eye Eyeball -  Bilateral-Extraocular movements intact. Sclera/Conjunctiva - Bilateral-No scleral icterus.  Chest and Lung Exam Chest and lung exam reveals -quiet, even and easy respiratory effort with no use of accessory muscles and on auscultation, normal breath sounds, no adventitious sounds and normal vocal resonance. Inspection Chest Wall - Normal. Back - normal.  Cardiovascular Cardiovascular examination reveals -normal heart sounds, regular rate and rhythm with no murmurs and normal pedal pulses bilaterally.  Neurologic Neurologic evaluation reveals -alert and oriented x 3 with no impairment of recent or remote memory. Mental Status-Normal.  Musculoskeletal Normal Exam - Left-Upper Extremity Strength Normal and Lower Extremity Strength Normal. Normal Exam - Right-Upper Extremity Strength Normal and Lower Extremity Strength Normal. Note: Left incision noted just above the left knee. Small seroma noted. Full range of motion. No obvious neurological deficits. No residual mass on examination today. This is about 4 cm to the lateral and superior to the patella.    Assessment & Plan (Brittany Jordan A. Hena Ewalt MD; 02/06/2019 10:22 AM)  [place] OF LEFT LOWER EXTREMITY (C49.22) Impression: left lateral knee  Refer to medical and radiation oncology.  Obtain preoperative MRI of left upper extremity for surgical management.  Discussed treatment options here or referral to a tertiary care center if the patient so desired. The l pros and cons of surgery. She will require reexcision given her positive margin in. Discussed referral to a tertiary care center as well explained to her that she may require this depending on her margin status after excision. Her goals would be to get a microscopic and grossly negative margins circumstance. Discussed the role of medical and radiation oncology as well. Discussed potential functional limitations afterwards the potential need further treatments and/or  aggressive surgery.. She like to proceed with treatment here for now understanding that she may require further treatment elsewhere and/or further treatments in general.  Risk of bleeding, infection, limb loss, limb dysfunction, the need for further treatments, blood clot, stroke, DVT, death  Current Plans Pt Education - [facility] Free Text Education/Instructions: discussed with patient and provided information. Pt Education - [facility] STOP SMOKING! Pt Education - [facility] - General recommendations

## 2019-02-10 ENCOUNTER — Other Ambulatory Visit: Payer: Self-pay | Admitting: Surgery

## 2019-02-10 ENCOUNTER — Encounter: Payer: Self-pay | Admitting: Oncology

## 2019-02-10 ENCOUNTER — Telehealth: Payer: Self-pay | Admitting: Oncology

## 2019-02-10 DIAGNOSIS — C4922 Malignant neoplasm of connective and soft tissue of left lower limb, including hip: Secondary | ICD-10-CM

## 2019-02-10 NOTE — Telephone Encounter (Signed)
Received a new referral from Dr. Brantley Stage at [facility]. Pt has been cld and scheduled to see Dr. Alen Blew on 4/7 at 2pm. Pt aware to arrive 15 minutes early. Letter mailed.

## 2019-02-13 ENCOUNTER — Ambulatory Visit
Admission: RE | Admit: 2019-02-13 | Discharge: 2019-02-13 | Disposition: A | Discharge status: Home / Self Care | Payer: Medicare HMO | Source: Ambulatory Visit | Attending: Surgery | Admitting: Surgery

## 2019-02-13 ENCOUNTER — Other Ambulatory Visit: Payer: Self-pay

## 2019-02-13 DIAGNOSIS — C4922 Malignant neoplasm of connective and soft tissue of left lower limb, including hip: Secondary | ICD-10-CM | POA: Diagnosis not present

## 2019-02-13 MED ORDER — GADOBENATE DIMEGLUMINE 529 MG/ML IV SOLN
19.0000 mL | Freq: Once | INTRAVENOUS | Status: AC | PRN
  Administered 2019-02-13: 19 mL via INTRAVENOUS

## 2019-02-15 ENCOUNTER — Ambulatory Visit: Payer: Medicare HMO

## 2019-02-15 ENCOUNTER — Ambulatory Visit: Payer: Self-pay | Admitting: Oncology

## 2019-02-15 ENCOUNTER — Ambulatory Visit
Admission: RE | Admit: 2019-02-15 | Discharge: 2019-02-15 | Disposition: A | Discharge status: Home / Self Care | Payer: Medicare HMO | Source: Ambulatory Visit | Attending: Radiation Oncology | Admitting: Radiation Oncology

## 2019-02-15 DIAGNOSIS — C4922 Malignant neoplasm of connective and soft tissue of left lower limb, including hip: Secondary | ICD-10-CM

## 2019-02-15 HISTORY — DX: Malignant neoplasm of connective and soft tissue of left lower limb, including hip: C49.22

## 2019-02-21 ENCOUNTER — Inpatient Hospital Stay: Payer: Medicare HMO | Admitting: Oncology

## 2019-02-27 ENCOUNTER — Other Ambulatory Visit: Payer: Self-pay

## 2019-02-27 ENCOUNTER — Ambulatory Visit
Admission: RE | Admit: 2019-02-27 | Discharge: 2019-02-27 | Disposition: A | Discharge status: Home / Self Care | Payer: Medicare HMO | Source: Ambulatory Visit | Attending: Radiation Oncology | Admitting: Radiation Oncology

## 2019-02-27 ENCOUNTER — Encounter: Payer: Self-pay | Admitting: Radiation Oncology

## 2019-02-27 DIAGNOSIS — Z86711 Personal history of pulmonary embolism: Secondary | ICD-10-CM | POA: Insufficient documentation

## 2019-02-27 DIAGNOSIS — K219 Gastro-esophageal reflux disease without esophagitis: Secondary | ICD-10-CM | POA: Diagnosis not present

## 2019-02-27 DIAGNOSIS — M329 Systemic lupus erythematosus, unspecified: Secondary | ICD-10-CM | POA: Diagnosis not present

## 2019-02-27 DIAGNOSIS — I10 Essential (primary) hypertension: Secondary | ICD-10-CM | POA: Diagnosis not present

## 2019-02-27 DIAGNOSIS — C4922 Malignant neoplasm of connective and soft tissue of left lower limb, including hip: Secondary | ICD-10-CM

## 2019-02-27 DIAGNOSIS — R69 Illness, unspecified: Secondary | ICD-10-CM | POA: Diagnosis not present

## 2019-02-27 DIAGNOSIS — D682 Hereditary deficiency of other clotting factors: Secondary | ICD-10-CM | POA: Insufficient documentation

## 2019-02-27 DIAGNOSIS — Z79899 Other long term (current) drug therapy: Secondary | ICD-10-CM | POA: Diagnosis not present

## 2019-02-27 DIAGNOSIS — F17210 Nicotine dependence, cigarettes, uncomplicated: Secondary | ICD-10-CM | POA: Diagnosis not present

## 2019-02-27 HISTORY — DX: Gastro-esophageal reflux disease without esophagitis: K21.9

## 2019-02-27 HISTORY — DX: Reserved for concepts with insufficient information to code with codable children: IMO0002

## 2019-02-27 HISTORY — DX: Essential (primary) hypertension: I10

## 2019-02-27 HISTORY — DX: Systemic lupus erythematosus, unspecified: M32.9

## 2019-02-27 HISTORY — DX: Calculus of gallbladder without cholecystitis without obstruction: K80.20

## 2019-02-27 NOTE — Progress Notes (Signed)
Radiation Oncology         [phone] ________________________________  Initial Outpatient Consultation  Name: Brittany Jordan MRN: [id number]  Date: 02/27/2019  DOB: Jordan 18, 1952  YQ:MVHQ, Brittany May, MD  Erroll Luna, MD   REFERRING PHYSICIAN: Erroll Luna, MD  DIAGNOSIS:  Liposarcoma presenting in the left lower thigh  HISTORY OF PRESENT ILLNESS::Brittany Jordan is a 68 y.o. female who is seen out of courtesy for Dr. Brantley Stage for consultation concerning her recently diagnosed liposarcoma. The patient reports having a swelling in this area over the last several years that began developing rapidly over the last 6 months. She was seen by her PCP Dr. Brigitte Pulse, who removed the lesion, initially this was thought to be a cystic structure. However, on pathologic review, this returned a liposarcoma, grade not specified and with positive surgical margins. Patient was then referred to Dr. Brantley Stage who is recommending additional surgery to clear these margins and obtain an oncologic resection. Radiation therapy has been consulted for evaluation. The patient was offered consultation at a tertiary care center but would prefer to receive her treatment closer to home.   Patient reports the lesion was approximately the size of 2 golf balls and somewhat soft in texture to palpation. She reports the lesion being visible through clothing. Patient demonstrated picture showing the lesion fitting in the palm of a gloved hand.  She was scheduled to meet with Dr. Alen Blew on 02/21/2019, but this was cancelled and has not been rescheduled. She is scheduled for re-excision under Dr. Brantley Stage on 04/04/2019.   PREVIOUS RADIATION THERAPY: No  PAST MEDICAL HISTORY:  has a past medical history of Cholelithiasis, Factor V deficiency ([place]), Gastroesophageal reflux disease, Hemoptysis, Hypertension, Lupus ([place]), and Pulmonary embolism ([place]).    PAST SURGICAL HISTORY: Past Surgical History:  Procedure Laterality Date    APPENDECTOMY  36   CHOLECYSTECTOMY  1992   TOTAL ABDOMINAL HYSTERECTOMY Bilateral 07/06/2001    FAMILY HISTORY: family history includes Alcohol abuse in her brother; Arthritis in her father, mother, and sister; Factor V Leiden deficiency in her father; Heart disease in her mother; Hypertension in her mother; Melanoma in her mother; Stroke in her father and mother.  SOCIAL HISTORY:  reports that she has been smoking cigarettes. She has a 34.40 pack-year smoking history. She has never used smokeless tobacco. She reports current alcohol use.  ALLERGIES: Penicillins  MEDICATIONS:  Current Outpatient Medications  Medication Sig Dispense Refill   amitriptyline (ELAVIL) 25 MG tablet TAKE 1 TO 2 TABLETS BY MOUTH EVERY DAY AT BEDTIME.     amLODipine (NORVASC) 5 MG tablet TAKE 1 TABLET BY MOUTH EVERYDAY AT BEDTIME     HYDROcodone-acetaminophen (NORCO/VICODIN) 5-325 MG tablet Take 1 tablet by mouth every 8 (eight) hours as needed. for pain     ibuprofen (ADVIL,MOTRIN) 200 MG tablet Take 200 mg by mouth every 6 (six) hours as needed.     loratadine-pseudoephedrine (CLARITIN-D 24-HOUR) 10-240 MG per 24 hr tablet Take 1 tablet by mouth daily as needed.      Magnesium 200 MG TABS Take by mouth.     Multiple Vitamins-Minerals (CENTRUM SILVER 50+WOMEN) TABS Take by mouth.     nystatin-triamcinolone (MYCOLOG II) cream      omeprazole (PRILOSEC) 20 MG capsule Take 20 mg by mouth daily.       pravastatin (PRAVACHOL) 10 MG tablet Take 10 mg by mouth daily.     Vitamin D, Cholecalciferol, 10 MCG (400 UNIT) TABS Take by mouth.  varenicline (CHANTIX) 1 MG tablet Take 1 mg by mouth 2 (two) times daily.       No current facility-administered medications for this encounter.     REVIEW OF SYSTEMS:  A 10+ POINT REVIEW OF SYSTEMS WAS OBTAINED including neurology, dermatology, psychiatry, cardiac, respiratory, lymph, extremities, GI, GU, musculoskeletal, constitutional, reproductive, HEENT. She  denies pain and any other symptoms. She is, however, anxious today.   PHYSICAL EXAM:  height is 5\' 4"  (1.626 m) and weight is 201 lb 2 oz (91.2 kg). Her oral temperature is 98.1 F (36.7 C). Her blood pressure is 142/89 (abnormal) and her pulse is 108 (abnormal). Her respiration is 16 and oxygen saturation is 98%.   General: Alert and oriented, in no acute distress HEENT: Head is normocephalic. Extraocular movements are intact. Oropharynx is clear. Neck: Neck is supple, no palpable cervical or supraclavicular lymphadenopathy. Heart: Regular in rate and rhythm with no murmurs, rubs, or gallops. Chest: Clear to auscultation bilaterally, with no rhonchi, wheezes, or rales. Abdomen: Soft, nontender, nondistended, with no rigidity or guarding. Extremities: Patient has a 5 cm scar superior and lateral to her left patella. No appreciable swelling in the left lower extremity. Skin: no concerning lesions. Musculoskeletal: symmetric strength and muscle tone throughout. Neurologic: Cranial nerves II through XII are grossly intact. No obvious focalities. Speech is fluent. Coordination is intact. Psychiatric: Judgment and insight are intact. Affect is appropriate. Examination of left inguinal area shows no palpable adenopathy.   ECOG = 1  0 - Asymptomatic (Fully active, able to carry on all predisease activities without restriction)  1 - Symptomatic but completely ambulatory (Restricted in physically strenuous activity but ambulatory and able to carry out work of a light or sedentary nature. For example, light housework, office work)  2 - Symptomatic, <50% in bed during the day (Ambulatory and capable of all self care but unable to carry out any work activities. Up and about more than 50% of waking hours)  3 - Symptomatic, >50% in bed, but not bedbound (Capable of only limited self-care, confined to bed or chair 50% or more of waking hours)  4 - Bedbound (Completely disabled. Cannot carry on any  self-care. Totally confined to bed or chair)  5 - Death   Eustace Pen MM, Creech RH, Tormey DC, et al. [phone]). "Toxicity and response criteria of the [facility]". [place] Oncol. 5 (6): 649-55  LABORATORY DATA:  Lab Results  Component Value Date   WBC 9.3 08/05/2010   HGB 13.8 08/05/2010   HCT 39.8 08/05/2010   MCV 90.8 08/05/2010   PLT 322 08/05/2010   NEUTROABS 4.1 08/04/2010   Lab Results  Component Value Date   NA 142 08/04/2010   K 3.5 08/04/2010   CL 107 08/04/2010   CO2 28 08/04/2010   GLUCOSE 109 (H) 08/04/2010   CREATININE 0.93 08/04/2010   CALCIUM 9.0 08/04/2010      RADIOGRAPHY: Mr Knee Left W Wo Contrast  Result Date: 02/13/2019 CLINICAL DATA:  History of resection of a liposarcoma from the lateral aspect of the left knee 3 weeks ago. EXAM: MRI OF THE LEFT KNEE WITHOUT AND WITH CONTRAST TECHNIQUE: Multiplanar, multisequence MR imaging of the knee was performed before and after the administration of intravenous contrast. CONTRAST:  19 mL MULTIHANCE GADOBENATE DIMEGLUMINE 529 MG/ML IV SOLN COMPARISON:  None. FINDINGS: Markers placed at the surgical site. There is some underlying subcutaneous edema consistent with recent surgery. No residual mass is identified. No focal fluid  collection. MENISCI Medial meniscus:  Intact. Lateral meniscus:  Intact. LIGAMENTS Cruciates:  Intact. Collaterals:  Intact. CARTILAGE Patellofemoral: Focal cartilage loss is present at the apex of the patella in the superior pole. Medial:  Negative. Lateral:  Negative. Joint:  Small effusion. Popliteal Fossa:  No Baker's cyst. Extensor Mechanism:  Intact. Bones: Tiny subchondral cysts are seen at the apex of the patella in the superior pole. No fracture or worrisome lesion. Other: None. IMPRESSION: Findings consistent with history of resection of a liposarcoma from the anterolateral aspect of the left knee. No residual tumor or evidence of postprocedural complication is identified.  This study can serve as a baseline for follow-up exams. Negative for meniscal or ligament tear. Mild patellofemoral osteoarthritis. Electronically Signed   By: Inge Rise M.D.   On: 02/13/2019 14:31      IMPRESSION: Liposarcoma presenting in the left lower thigh  Patient is scheduled for re-excision on 04/04/2019. Patient is scheduled for re-evaluation in radiation oncology on 04/17/2019 at 8:30 am. Discussed with the patient the indications of postoperative radiation being positive surgical margins, high grade lesion, and large tumor size. MRI of the left knee on 02/13/2019 showed no residual tumor.  PLAN: Surgery with Dr. Brantley Stage on 04/04/2019 and re-evaluation with radiation oncology on 04/17/2019.     ------------------------------------------------  Blair Promise, PhD, MD  This document serves as a record of services personally performed by Gery Pray, MD. It was created on his behalf by Wilburn Mylar, a trained medical scribe. The creation of this record is based on the scribe's personal observations and the provider's statements to them. This document has been checked and approved by the attending provider.

## 2019-02-27 NOTE — Patient Instructions (Signed)
Coronavirus (COVID-19) Are you at risk?  Are you at risk for the Coronavirus (COVID-19)?  To be considered HIGH RISK for Coronavirus (COVID-19), you have to meet the following criteria:  . Traveled to [place], [place], [place], [place] or [place]; or in the [place] to [place], [place], [place], or [place]; and have fever, cough, and shortness of breath within the last 2 weeks of travel OR . Been in close contact with a person diagnosed with COVID-19 within the last 2 weeks and have fever, cough, and shortness of breath . IF YOU DO NOT MEET THESE CRITERIA, YOU ARE CONSIDERED LOW RISK FOR COVID-19.  What to do if you are HIGH RISK for COVID-19?  . If you are having a medical emergency, call 911. . Seek medical care right away. Before you go to a doctor's office, urgent care or emergency department, call ahead and tell them about your recent travel, contact with someone diagnosed with COVID-19, and your symptoms. You should receive instructions from your physician's office regarding next steps of care.  . When you arrive at healthcare provider, tell the healthcare staff immediately you have returned from visiting [place], [place], [place], [place] or [place]; or traveled in the [place] to [place], [place], [place], or [place]; in the last two weeks or you have been in close contact with a person diagnosed with COVID-19 in the last 2 weeks.   . Tell the health care staff about your symptoms: fever, cough and shortness of breath. . After you have been seen by a medical provider, you will be either: o Tested for (COVID-19) and discharged home on quarantine except to seek medical care if symptoms worsen, and asked to  - Stay home and avoid contact with others until you get your results (4-5 days)  - Avoid travel on public transportation if possible (such as bus, train, or airplane) or o Sent to the Emergency Department by EMS for evaluation, COVID-19 testing, and possible  admission depending on your condition and test results.  What to do if you are LOW RISK for COVID-19?  Reduce your risk of any infection by using the same precautions used for avoiding the common cold or flu:  . Wash your hands often with soap and warm water for at least 20 seconds.  If soap and water are not readily available, use an alcohol-based hand sanitizer with at least 60% alcohol.  . If coughing or sneezing, cover your mouth and nose by coughing or sneezing into the elbow areas of your shirt or coat, into a tissue or into your sleeve (not your hands). . Avoid shaking hands with others and consider head nods or verbal greetings only. . Avoid touching your eyes, nose, or mouth with unwashed hands.  . Avoid close contact with people who are sick. . Avoid places or events with large numbers of people in one location, like concerts or sporting events. . Carefully consider travel plans you have or are making. . If you are planning any travel outside or inside the [place], visit the CDC's Travelers' Health webpage for the latest health notices. . If you have some symptoms but not all symptoms, continue to monitor at home and seek medical attention if your symptoms worsen. . If you are having a medical emergency, call 911.   ADDITIONAL HEALTHCARE OPTIONS FOR PATIENTS  [facility] Tele[facility] / e-Visit: [email or web address]         [facility]: [phone]     Urgent Care: [phone]                   [facility]: [phone]   

## 2019-02-27 NOTE — Progress Notes (Signed)
Histology and Location of Primary Cancer: Skin (A), left knee mass, excision PRELIMINARY DIAGNOSIS: FINDINGS CONSISTENT WITH LIPOSARCOMA, MARGINS INVOLVED, SEE DESCRIPTION Microscopic Description Sections show mature adipose tissue with scattered markedly atypical cells with enlarged and smudgy nuclear chromatin. Some of these cells are multinucleated and some have a lipoblast-like appearance. The margins are involved.  Location(s) of Symptomatic tumor(s): just above her left knee on the lateral aspect of the lower leg   Past/Anticipated chemotherapy by medical oncology, if any: initial consult with Dr. Alen Blew 02/21/19  Patient's main complaints related to symptomatic tumor(s) are: Pt reports that she assumed lesion was a cyst. Otherwise, pt w/o complaints.  Pain on a scale of 0-10 is: Pt denies pain.   Ambulatory status? Walker? Wheelchair?: ambulatory without assistive device. Steady gait.   SAFETY ISSUES:  Prior radiation? No  Pacemaker/ICD? No  Possible current pregnancy? No  Is the patient on methotrexate? No  Additional Complaints / other details:  Pt presents today for initial consult with Dr. Sondra Come for Radiation Oncology.Pt is anxious. Pt is unaccompanied.   BP (!) 142/89 (BP Location: Left Arm, Patient Position: Sitting)   Pulse (!) 108   Temp 98.1 F (36.7 C) (Oral)   Resp 16   Ht 5\' 4"  (1.626 m)   Wt 201 lb 2 oz (91.2 kg)   SpO2 98%   BMI 34.52 kg/m   Wt Readings from Last 3 Encounters:  02/27/19 201 lb 2 oz (91.2 kg)  04/08/11 194 lb (88 kg)   Loma Sousa, RN BSN

## 2019-03-14 DIAGNOSIS — C4922 Malignant neoplasm of connective and soft tissue of left lower limb, including hip: Secondary | ICD-10-CM | POA: Insufficient documentation

## 2019-04-05 ENCOUNTER — Other Ambulatory Visit: Payer: Self-pay

## 2019-04-05 ENCOUNTER — Encounter (HOSPITAL_BASED_OUTPATIENT_CLINIC_OR_DEPARTMENT_OTHER): Payer: Self-pay | Admitting: *Deleted

## 2019-04-11 ENCOUNTER — Other Ambulatory Visit (HOSPITAL_COMMUNITY)
Admission: RE | Admit: 2019-04-11 | Discharge: 2019-04-11 | Disposition: A | Discharge status: Home / Self Care | Payer: Medicare HMO | Source: Ambulatory Visit | Attending: Surgery | Admitting: Surgery

## 2019-04-11 ENCOUNTER — Encounter (HOSPITAL_BASED_OUTPATIENT_CLINIC_OR_DEPARTMENT_OTHER)
Admission: RE | Admit: 2019-04-11 | Discharge: 2019-04-11 | Disposition: A | Discharge status: Home / Self Care | Payer: Medicare HMO | Source: Ambulatory Visit | Attending: Surgery | Admitting: Surgery

## 2019-04-11 ENCOUNTER — Other Ambulatory Visit: Payer: Self-pay

## 2019-04-11 DIAGNOSIS — Z01818 Encounter for other preprocedural examination: Secondary | ICD-10-CM | POA: Diagnosis not present

## 2019-04-11 DIAGNOSIS — Z1159 Encounter for screening for other viral diseases: Secondary | ICD-10-CM | POA: Diagnosis not present

## 2019-04-11 DIAGNOSIS — I10 Essential (primary) hypertension: Secondary | ICD-10-CM | POA: Insufficient documentation

## 2019-04-11 LAB — COMPREHENSIVE METABOLIC PANEL
ALT: 25 U/L (ref 0–44)
AST: 29 U/L (ref 15–41)
Albumin: 4.5 g/dL (ref 3.5–5.0)
Alkaline Phosphatase: 81 U/L (ref 38–126)
Anion gap: 17 — ABNORMAL HIGH (ref 5–15)
BUN: 15 mg/dL (ref 8–23)
CO2: 19 mmol/L — ABNORMAL LOW (ref 22–32)
Calcium: 10.1 mg/dL (ref 8.9–10.3)
Chloride: 104 mmol/L (ref 98–111)
Creatinine, Ser: 0.8 mg/dL (ref 0.44–1.00)
GFR calc Af Amer: >60 mL/min (ref >60)
GFR calc non Af Amer: >60 mL/min (ref >60)
Glucose, Bld: 74 mg/dL (ref 70–99)
Potassium: 5.1 mmol/L (ref 3.5–5.1)
Sodium: 140 mmol/L (ref 135–145)
Total Bilirubin: 0.9 mg/dL (ref 0.3–1.2)
Total Protein: 7.5 g/dL (ref 6.5–8.1)

## 2019-04-11 LAB — CBC WITH DIFFERENTIAL/PLATELET
Abs Immature Granulocytes: 0.04 10*3/uL (ref 0.00–0.07)
Basophils Absolute: 0 10*3/uL (ref 0.0–0.1)
Basophils Relative: 1 %
Eosinophils Absolute: 0.1 10*3/uL (ref 0.0–0.5)
Eosinophils Relative: 1 %
HCT: 49.8 % — ABNORMAL HIGH (ref 36.0–46.0)
Hemoglobin: 16.6 g/dL — ABNORMAL HIGH (ref 12.0–15.0)
Immature Granulocytes: 1 %
Lymphocytes Relative: 32 %
Lymphs Abs: 2.6 10*3/uL (ref 0.7–4.0)
MCH: 30.3 pg (ref 26.0–34.0)
MCHC: 33.3 g/dL (ref 30.0–36.0)
MCV: 90.9 fL (ref 80.0–100.0)
Monocytes Absolute: 0.5 10*3/uL (ref 0.1–1.0)
Monocytes Relative: 6 %
Neutro Abs: 5 10*3/uL (ref 1.7–7.7)
Neutrophils Relative %: 59 %
Platelets: 359 10*3/uL (ref 150–400)
RBC: 5.48 MIL/uL — ABNORMAL HIGH (ref 3.87–5.11)
RDW: 12.4 % (ref 11.5–15.5)
WBC: 8.3 10*3/uL (ref 4.0–10.5)
nRBC: 0 % (ref 0.0–0.2)

## 2019-04-11 LAB — SARS CORONAVIRUS 2 BY RT PCR (HOSPITAL ORDER, PERFORMED IN ~~LOC~~ HOSPITAL LAB): SARS Coronavirus 2: NEGATIVE

## 2019-04-11 NOTE — Progress Notes (Addendum)
Ensure pre surgery drink given with instructions to complete by [facility], surgical soap given with instructions, pt verbalized understanding.  EKG reviewed by Dr. Sabra Heck, will proceed with surgery as scheduled.

## 2019-04-13 ENCOUNTER — Ambulatory Visit (HOSPITAL_BASED_OUTPATIENT_CLINIC_OR_DEPARTMENT_OTHER): Payer: Medicare HMO | Admitting: Anesthesiology

## 2019-04-13 ENCOUNTER — Ambulatory Visit (HOSPITAL_BASED_OUTPATIENT_CLINIC_OR_DEPARTMENT_OTHER)
Admission: RE | Admit: 2019-04-13 | Discharge: 2019-04-13 | Disposition: A | Discharge status: Home / Self Care | Payer: Medicare HMO | Attending: Surgery | Admitting: Surgery

## 2019-04-13 ENCOUNTER — Encounter (HOSPITAL_BASED_OUTPATIENT_CLINIC_OR_DEPARTMENT_OTHER): Payer: Self-pay | Admitting: *Deleted

## 2019-04-13 ENCOUNTER — Other Ambulatory Visit: Payer: Self-pay

## 2019-04-13 ENCOUNTER — Encounter (HOSPITAL_BASED_OUTPATIENT_CLINIC_OR_DEPARTMENT_OTHER)
Admission: RE | Disposition: A | Discharge status: Home / Self Care | Payer: Self-pay | Source: Home / Self Care | Attending: Surgery

## 2019-04-13 DIAGNOSIS — Z79899 Other long term (current) drug therapy: Secondary | ICD-10-CM | POA: Insufficient documentation

## 2019-04-13 DIAGNOSIS — Z880 Allergy status to penicillin: Secondary | ICD-10-CM | POA: Diagnosis not present

## 2019-04-13 DIAGNOSIS — I2699 Other pulmonary embolism without acute cor pulmonale: Secondary | ICD-10-CM | POA: Diagnosis not present

## 2019-04-13 DIAGNOSIS — M60262 Foreign body granuloma of soft tissue, not elsewhere classified, left lower leg: Secondary | ICD-10-CM | POA: Diagnosis not present

## 2019-04-13 DIAGNOSIS — C4922 Malignant neoplasm of connective and soft tissue of left lower limb, including hip: Secondary | ICD-10-CM | POA: Diagnosis not present

## 2019-04-13 DIAGNOSIS — E7800 Pure hypercholesterolemia, unspecified: Secondary | ICD-10-CM | POA: Diagnosis not present

## 2019-04-13 DIAGNOSIS — R042 Hemoptysis: Secondary | ICD-10-CM | POA: Diagnosis not present

## 2019-04-13 DIAGNOSIS — F17200 Nicotine dependence, unspecified, uncomplicated: Secondary | ICD-10-CM | POA: Diagnosis not present

## 2019-04-13 DIAGNOSIS — L923 Foreign body granuloma of the skin and subcutaneous tissue: Secondary | ICD-10-CM | POA: Diagnosis not present

## 2019-04-13 DIAGNOSIS — K219 Gastro-esophageal reflux disease without esophagitis: Secondary | ICD-10-CM | POA: Insufficient documentation

## 2019-04-13 DIAGNOSIS — R69 Illness, unspecified: Secondary | ICD-10-CM | POA: Diagnosis not present

## 2019-04-13 DIAGNOSIS — I10 Essential (primary) hypertension: Secondary | ICD-10-CM | POA: Insufficient documentation

## 2019-04-13 DIAGNOSIS — J449 Chronic obstructive pulmonary disease, unspecified: Secondary | ICD-10-CM | POA: Diagnosis not present

## 2019-04-13 HISTORY — DX: Other complications of anesthesia, initial encounter: T88.59XA

## 2019-04-13 HISTORY — DX: Nausea with vomiting, unspecified: R11.2

## 2019-04-13 HISTORY — DX: Depression, unspecified: F32.A

## 2019-04-13 HISTORY — DX: Other specified postprocedural states: Z98.890

## 2019-04-13 HISTORY — PX: MELANOMA EXCISION: SHX5266

## 2019-04-13 HISTORY — DX: Anxiety disorder, unspecified: F41.9

## 2019-04-13 SURGERY — EXCISION, MELANOMA
Anesthesia: General | Site: Leg Lower | Laterality: Left

## 2019-04-13 MED ORDER — BUPIVACAINE-EPINEPHRINE (PF) 0.25% -1:200000 IJ SOLN
INTRAMUSCULAR | Status: AC
  Filled 2019-04-13: qty 30

## 2019-04-13 MED ORDER — MIDAZOLAM HCL 2 MG/2ML IJ SOLN
1.0000 mg | INTRAMUSCULAR | Status: DC | PRN
  Administered 2019-04-13: 2 mg via INTRAVENOUS

## 2019-04-13 MED ORDER — BUPIVACAINE-EPINEPHRINE (PF) 0.5% -1:200000 IJ SOLN
INTRAMUSCULAR | Status: AC
  Filled 2019-04-13: qty 30

## 2019-04-13 MED ORDER — FENTANYL CITRATE (PF) 100 MCG/2ML IJ SOLN
50.0000 ug | INTRAMUSCULAR | Status: DC | PRN
  Administered 2019-04-13 (×2): 50 ug via INTRAVENOUS

## 2019-04-13 MED ORDER — PHENYLEPHRINE 40 MCG/ML (10ML) SYRINGE FOR IV PUSH (FOR BLOOD PRESSURE SUPPORT)
PREFILLED_SYRINGE | INTRAVENOUS | Status: AC
  Filled 2019-04-13: qty 10

## 2019-04-13 MED ORDER — EPHEDRINE SULFATE-NACL 50-0.9 MG/10ML-% IV SOSY
PREFILLED_SYRINGE | INTRAVENOUS | Status: DC | PRN
  Administered 2019-04-13 (×2): 5 mg via INTRAVENOUS

## 2019-04-13 MED ORDER — MIDAZOLAM HCL 2 MG/2ML IJ SOLN
INTRAMUSCULAR | Status: AC
  Filled 2019-04-13: qty 2

## 2019-04-13 MED ORDER — ONDANSETRON HCL 4 MG/2ML IJ SOLN
INTRAMUSCULAR | Status: AC
  Filled 2019-04-13: qty 2

## 2019-04-13 MED ORDER — SCOPOLAMINE 1 MG/3DAYS TD PT72: 1.0000 | MEDICATED_PATCH | Freq: Once | TRANSDERMAL | Status: DC | PRN

## 2019-04-13 MED ORDER — ACETAMINOPHEN 500 MG PO TABS
ORAL_TABLET | ORAL | Status: AC
  Filled 2019-04-13: qty 2

## 2019-04-13 MED ORDER — DEXAMETHASONE SODIUM PHOSPHATE 4 MG/ML IJ SOLN
INTRAMUSCULAR | Status: DC | PRN
  Administered 2019-04-13: 10 mg via INTRAVENOUS

## 2019-04-13 MED ORDER — ONDANSETRON HCL 4 MG/2ML IJ SOLN
INTRAMUSCULAR | Status: DC | PRN
  Administered 2019-04-13: 4 mg via INTRAVENOUS

## 2019-04-13 MED ORDER — MEPERIDINE HCL 25 MG/ML IJ SOLN: 6.2500 mg | INTRAMUSCULAR | Status: DC | PRN

## 2019-04-13 MED ORDER — OXYCODONE HCL 5 MG PO TABS: 5.0000 mg | ORAL_TABLET | Freq: Once | ORAL | Status: DC | PRN

## 2019-04-13 MED ORDER — DEXAMETHASONE SODIUM PHOSPHATE 10 MG/ML IJ SOLN
INTRAMUSCULAR | Status: AC
  Filled 2019-04-13: qty 1

## 2019-04-13 MED ORDER — BUPIVACAINE-EPINEPHRINE 0.25% -1:200000 IJ SOLN
INTRAMUSCULAR | Status: AC
  Filled 2019-04-13: qty 1

## 2019-04-13 MED ORDER — PHENYLEPHRINE 40 MCG/ML (10ML) SYRINGE FOR IV PUSH (FOR BLOOD PRESSURE SUPPORT)
PREFILLED_SYRINGE | INTRAVENOUS | Status: DC | PRN
  Administered 2019-04-13 (×5): 80 ug via INTRAVENOUS

## 2019-04-13 MED ORDER — GABAPENTIN 300 MG PO CAPS
300.0000 mg | ORAL_CAPSULE | ORAL | Status: AC
  Administered 2019-04-13: 07:00:00 300 mg via ORAL

## 2019-04-13 MED ORDER — CHLORHEXIDINE GLUCONATE CLOTH 2 % EX PADS: 6.0000 | MEDICATED_PAD | Freq: Once | CUTANEOUS | Status: DC

## 2019-04-13 MED ORDER — HYDROMORPHONE HCL 1 MG/ML IJ SOLN: 0.2500 mg | INTRAMUSCULAR | Status: DC | PRN

## 2019-04-13 MED ORDER — BUPIVACAINE-EPINEPHRINE 0.25% -1:200000 IJ SOLN
INTRAMUSCULAR | Status: DC | PRN
  Administered 2019-04-13: 10 mL

## 2019-04-13 MED ORDER — IBUPROFEN 800 MG PO TABS: 800.0000 mg | ORAL_TABLET | Freq: Three times a day (TID) | ORAL | 0 refills | Status: DC | PRN

## 2019-04-13 MED ORDER — LACTATED RINGERS IV SOLN
INTRAVENOUS | Status: DC
  Administered 2019-04-13: 07:00:00 via INTRAVENOUS

## 2019-04-13 MED ORDER — LIDOCAINE 2% (20 MG/ML) 5 ML SYRINGE
INTRAMUSCULAR | Status: DC | PRN
  Administered 2019-04-13: 80 mg via INTRAVENOUS

## 2019-04-13 MED ORDER — CLINDAMYCIN PHOSPHATE 900 MG/50ML IV SOLN
900.0000 mg | INTRAVENOUS | Status: AC
  Administered 2019-04-13: 900 mg via INTRAVENOUS

## 2019-04-13 MED ORDER — OXYCODONE HCL 5 MG/5ML PO SOLN: 5.0000 mg | Freq: Once | ORAL | Status: DC | PRN

## 2019-04-13 MED ORDER — CLINDAMYCIN PHOSPHATE 900 MG/50ML IV SOLN
INTRAVENOUS | Status: AC
  Filled 2019-04-13: qty 50

## 2019-04-13 MED ORDER — FENTANYL CITRATE (PF) 100 MCG/2ML IJ SOLN
INTRAMUSCULAR | Status: AC
  Filled 2019-04-13: qty 2

## 2019-04-13 MED ORDER — ACETAMINOPHEN 500 MG PO TABS
1000.0000 mg | ORAL_TABLET | ORAL | Status: AC
  Administered 2019-04-13: 07:00:00 1000 mg via ORAL

## 2019-04-13 MED ORDER — PROMETHAZINE HCL 25 MG/ML IJ SOLN: 6.2500 mg | INTRAMUSCULAR | Status: DC | PRN

## 2019-04-13 MED ORDER — PROPOFOL 10 MG/ML IV BOLUS
INTRAVENOUS | Status: DC | PRN
  Administered 2019-04-13: 50 mg via INTRAVENOUS
  Administered 2019-04-13: 150 mg via INTRAVENOUS

## 2019-04-13 MED ORDER — GABAPENTIN 300 MG PO CAPS
ORAL_CAPSULE | ORAL | Status: AC
  Filled 2019-04-13: qty 1

## 2019-04-13 SURGICAL SUPPLY — 40 items
BENZOIN TINCTURE PRP APPL 2/3 (GAUZE/BANDAGES/DRESSINGS) IMPLANT
BLADE SURG 10 STRL SS (BLADE) IMPLANT
BLADE SURG 15 STRL LF DISP TIS (BLADE) ×1 IMPLANT
BLADE SURG 15 STRL SS (BLADE) ×1
BNDG COHESIVE 4X5 TAN STRL (GAUZE/BANDAGES/DRESSINGS) ×2 IMPLANT
CANISTER SUCT 1200ML W/VALVE (MISCELLANEOUS) IMPLANT
CHLORAPREP W/TINT 26 (MISCELLANEOUS) ×2 IMPLANT
COVER BACK TABLE REUSABLE LG (DRAPES) ×2 IMPLANT
COVER MAYO STAND REUSABLE (DRAPES) ×2 IMPLANT
COVER WAND RF STERILE (DRAPES) IMPLANT
DECANTER SPIKE VIAL GLASS SM (MISCELLANEOUS) IMPLANT
DRAPE LAPAROTOMY 100X72 PEDS (DRAPES) ×2 IMPLANT
DRAPE UTILITY XL STRL (DRAPES) ×2 IMPLANT
ELECT COATED BLADE 2.86 ST (ELECTRODE) ×2 IMPLANT
ELECT REM PT RETURN 9FT ADLT (ELECTROSURGICAL) ×2
ELECTRODE REM PT RTRN 9FT ADLT (ELECTROSURGICAL) ×1 IMPLANT
GLOVE BIOGEL PI IND STRL 7.5 (GLOVE) ×3 IMPLANT
GLOVE BIOGEL PI IND STRL 8 (GLOVE) ×1 IMPLANT
GLOVE BIOGEL PI INDICATOR 7.5 (GLOVE) ×3
GLOVE BIOGEL PI INDICATOR 8 (GLOVE) ×1
GLOVE ECLIPSE 8.0 STRL XLNG CF (GLOVE) ×2 IMPLANT
GOWN STRL REUS W/ TWL LRG LVL3 (GOWN DISPOSABLE) ×2 IMPLANT
GOWN STRL REUS W/TWL LRG LVL3 (GOWN DISPOSABLE) ×2
KIT MARKER MARGIN INK (KITS) ×2 IMPLANT
NEEDLE HYPO 25X1 1.5 SAFETY (NEEDLE) ×2 IMPLANT
NS IRRIG 1000ML POUR BTL (IV SOLUTION) ×2 IMPLANT
PACK BASIN DAY SURGERY FS (CUSTOM PROCEDURE TRAY) ×2 IMPLANT
PENCIL BUTTON HOLSTER BLD 10FT (ELECTRODE) ×2 IMPLANT
SLEEVE SCD COMPRESS KNEE MED (MISCELLANEOUS) ×2 IMPLANT
SPONGE LAP 4X18 RFD (DISPOSABLE) IMPLANT
STAPLER VISISTAT 35W (STAPLE) IMPLANT
STRIP CLOSURE SKIN 1/2X4 (GAUZE/BANDAGES/DRESSINGS) IMPLANT
SUT MON AB 4-0 PC3 18 (SUTURE) ×4 IMPLANT
SUT VICRYL 3-0 CR8 SH (SUTURE) ×2 IMPLANT
SUT VICRYL AB 3 0 TIES (SUTURE) IMPLANT
SYR BULB 3OZ (MISCELLANEOUS) ×2 IMPLANT
SYR CONTROL 10ML LL (SYRINGE) ×2 IMPLANT
TOWEL GREEN STERILE FF (TOWEL DISPOSABLE) ×4 IMPLANT
TUBE CONNECTING 20X1/4 (TUBING) ×2 IMPLANT
YANKAUER SUCT BULB TIP NO VENT (SUCTIONS) ×2 IMPLANT

## 2019-04-13 NOTE — Progress Notes (Signed)
Histology and Location of Primary Cancer:Skin (A), left knee mass, excision PRELIMINARY DIAGNOSIS: FINDINGS CONSISTENT WITH LIPOSARCOMA, MARGINS INVOLVED, SEE DESCRIPTION Microscopic Description Sections show mature adipose tissue with scattered markedly atypical cells with enlarged and smudgy nuclear chromatin. Some of these cells are multinucleated and some have a lipoblast-like appearance. The margins are involved.   Location(s) of Symptomatic tumor(s): just above her left knee on the lateral aspect of the upper leg    Past/Anticipated intervention by surgeon 04/13/19 Procedure: Excision left thigh 4 cm liposarcoma  Surgeon: Erroll Luna, MD  Patient's main complaints related to symptomatic tumor(s) are: Pt recently had surgery to resect liposarcoma. Pt reports incision has bled and surgical glue has started to peel.  Pain on a scale of 0-10 is: Pt rates pain, LEFT leg lateral aspect of upper leg, 4/10     Ambulatory status? Walker? Wheelchair?: Pt ambulates with steady gait without assistive device.  SAFETY ISSUES:  Prior radiation? no  Pacemaker/ICD? no  Possible current pregnancy? no  Is the patient on methotrexate? no  Additional Complaints / other details:  Pt had surgery 04/13/19.   BP (!) 157/80 (BP Location: Left Arm, Patient Position: Sitting)   Pulse 98   Temp 98.5 F (36.9 C) (Temporal)   Resp 18   Ht 5\' 3"  (1.6 m)   Wt 205 lb 2 oz (93 kg)   SpO2 99%   BMI 36.34 kg/m   Wt Readings from Last 3 Encounters:  04/17/19 205 lb 2 oz (93 kg)  04/13/19 203 lb 6.4 oz (92.3 kg)  02/27/19 201 lb 2 oz (91.2 kg)   Loma Sousa, RN BSN

## 2019-04-13 NOTE — Discharge Instructions (Signed)
GENERAL SURGERY: POST OP INSTRUCTIONS ° °###################################################################### ° °EAT °Gradually transition to a high fiber diet with a fiber supplement over the next few weeks after discharge.  Start with a pureed / full liquid diet (see below) ° °WALK °Walk an hour a day.  Control your pain to do that.   ° °CONTROL PAIN °Control pain so that you can walk, sleep, tolerate sneezing/coughing, go up/down stairs. ° °HAVE A BOWEL MOVEMENT DAILY °Keep your bowels regular to avoid problems.  OK to try a laxative to override constipation.  OK to use an antidairrheal to slow down diarrhea.  Call if not better after 2 tries ° °CALL IF YOU HAVE PROBLEMS/CONCERNS °Call if you are still struggling despite following these instructions. °Call if you have concerns not answered by these instructions ° °###################################################################### ° ° ° °1. DIET: Follow a light bland diet the first 24 hours after arrival home, such as soup, liquids, crackers, etc.  Be sure to include lots of fluids daily.  Avoid fast food or heavy meals as your are more likely to get nauseated.   °2. Take your usually prescribed home medications unless otherwise directed. °3. PAIN CONTROL: °a. Pain is best controlled by a usual combination of three different methods TOGETHER: °i. Ice/Heat °ii. Over the counter pain medication °iii. Prescription pain medication °b. Most patients will experience some swelling and bruising around the incisions.  Ice packs or heating pads (30-60 minutes up to 6 times a day) will help. Use ice for the first few days to help decrease swelling and bruising, then switch to heat to help relax tight/sore spots and speed recovery.  Some people prefer to use ice alone, heat alone, alternating between ice & heat.  Experiment to what works for you.  Swelling and bruising can take several weeks to resolve.   °c. It is helpful to take an over-the-counter pain medication  regularly for the first few weeks.  Choose one of the following that works best for you: °i. Naproxen (Aleve, etc)  Two 220mg tabs twice a day °ii. Ibuprofen (Advil, etc) Three 200mg tabs four times a day (every meal & bedtime) °iii. Acetaminophen (Tylenol, etc) 500-650mg four times a day (every meal & bedtime) °d. A  prescription for pain medication (such as oxycodone, hydrocodone, etc) should be given to you upon discharge.  Take your pain medication as prescribed.  °i. If you are having problems/concerns with the prescription medicine (does not control pain, nausea, vomiting, rash, itching, etc), please call us [phone] to see if we need to switch you to a different pain medicine that will work better for you and/or control your side effect better. °ii. If you need a refill on your pain medication, please contact your pharmacy.  They will contact our office to request authorization. Prescriptions will not be filled after 5 pm or on week-ends. °4. Avoid getting constipated.  Between the surgery and the pain medications, it is common to experience some constipation.  Increasing fluid intake and taking a fiber supplement (such as Metamucil, Citrucel, FiberCon, MiraLax, etc) 1-2 times a day regularly will usually help prevent this problem from occurring.  A mild laxative (prune juice, Milk of Magnesia, MiraLax, etc) should be taken according to package directions if there are no bowel movements after 48 hours.   °5. Wash / shower every day.  You may shower over the dressings as they are waterproof.  Continue to shower over incision(s) after the dressing is off. °6. Remove your waterproof bandages   5 days after surgery.  You may leave the incision open to air.  You may have skin tapes (Steri Strips) covering the incision(s).  Leave them on until one week, then remove.  You may replace a dressing/Band-Aid to cover the incision for comfort if you wish.  ° ° ° ° °7. ACTIVITIES as tolerated:   °a. You may resume  regular (light) daily activities beginning the next day--such as daily self-care, walking, climbing stairs--gradually increasing activities as tolerated.  If you can walk 30 minutes without difficulty, it is safe to try more intense activity such as jogging, treadmill, bicycling, low-impact aerobics, swimming, etc. °b. Save the most intensive and strenuous activity for last such as sit-ups, heavy lifting, contact sports, etc  Refrain from any heavy lifting or straining until you are off narcotics for pain control.   °c. DO NOT PUSH THROUGH PAIN.  Let pain be your guide: If it hurts to do something, don't do it.  Pain is your body warning you to avoid that activity for another week until the pain goes down. °d. You may drive when you are no longer taking prescription pain medication, you can comfortably wear a seatbelt, and you can safely maneuver your car and apply brakes. °e. You may have sexual intercourse when it is comfortable.  °8. FOLLOW UP in our office °a. Please call [facility] at [phone] to set up an appointment to see your surgeon in the office for a follow-up appointment approximately 2-3 weeks after your surgery. °b. Make sure that you call for this appointment the day you arrive home to insure a convenient appointment time. °9. IF YOU HAVE DISABILITY OR FAMILY LEAVE FORMS, BRING THEM TO THE OFFICE FOR PROCESSING.  DO NOT GIVE THEM TO YOUR DOCTOR. ° ° °WHEN TO CALL US [phone]: °1. Poor pain control °2. Reactions / problems with new medications (rash/itching, nausea, etc)  °3. Fever over 101.5 F (38.5 C) °4. Worsening swelling or bruising °5. Continued bleeding from incision. °6. Increased pain, redness, or drainage from the incision °7. Difficulty breathing / swallowing ° ° The clinic staff is available to answer your questions during regular business hours (8:30am-5pm).  Please don’t hesitate to call and ask to speak to one of our nurses for clinical concerns.  ° If you have a medical emergency,  go to the nearest emergency room or call 911. ° A surgeon from [facility] is always on call at the hospitals ° ° °[facility] °[address], [address], [place], [place]  [address] ? °MAIN: [phone] ? TOLL FREE: [phone] ?  °FAX [phone] °[email or web address] ° ° °Post Anesthesia Home Care Instructions ° °Activity: °Get plenty of rest for the remainder of the day. A responsible individual must stay with you for 24 hours following the procedure.  °For the next 24 hours, DO NOT: °-Drive a car °-Operate machinery °-Drink alcoholic beverages °-Take any medication unless instructed by your physician °-Make any legal decisions or sign important papers. ° °Meals: °Start with liquid foods such as gelatin or soup. Progress to regular foods as tolerated. Avoid greasy, spicy, heavy foods. If nausea and/or vomiting occur, drink only clear liquids until the nausea and/or vomiting subsides. Call your physician if vomiting continues. ° °Special Instructions/Symptoms: °Your throat may feel dry or sore from the anesthesia or the breathing tube placed in your throat during surgery. If this causes discomfort, gargle with warm salt water. The discomfort should disappear within 24 hours. ° °  If you had a scopolamine patch placed behind your ear for the management of post- operative nausea and/or vomiting: ° °1. The medication in the patch is effective for 72 hours, after which it should be removed.  Wrap patch in a tissue and discard in the trash. Wash hands thoroughly with soap and water. °2. You may remove the patch earlier than 72 hours if you experience unpleasant side effects which may include dry mouth, dizziness or visual disturbances. °3. Avoid touching the patch. Wash your hands with soap and water after contact with the patch. °  ° ° °

## 2019-04-13 NOTE — Anesthesia Procedure Notes (Signed)
Procedure Name: LMA Insertion Date/Time: 04/13/2019 7:38 AM Performed by: Lieutenant Diego, CRNA Pre-anesthesia Checklist: Patient identified, Emergency Drugs available, Suction available and Patient being monitored Patient Re-evaluated:Patient Re-evaluated prior to induction Oxygen Delivery Method: Circle system utilized Preoxygenation: Pre-oxygenation with 100% oxygen Induction Type: IV induction Ventilation: Mask ventilation without difficulty LMA: LMA inserted LMA Size: 4.0 Number of attempts: 1 Placement Confirmation: positive ETCO2 and breath sounds checked- equal and bilateral Tube secured with: Tape Dental Injury: Teeth and Oropharynx as per pre-operative assessment

## 2019-04-13 NOTE — Anesthesia Postprocedure Evaluation (Signed)
Anesthesia Post Note  Patient: Brittany Jordan  Procedure(s) Performed: EXCISION LEFT LEG LIPOSARCOMA (Left Leg Lower)     Patient location during evaluation: PACU Anesthesia Type: General Level of consciousness: awake and alert Pain management: pain level controlled Vital Signs Assessment: post-procedure vital signs reviewed and stable Respiratory status: spontaneous breathing, nonlabored ventilation and respiratory function stable Cardiovascular status: blood pressure returned to baseline and stable Postop Assessment: no apparent nausea or vomiting Anesthetic complications: no    Last Vitals:  Vitals:   04/13/19 0852 04/13/19 0906  BP:  (!) 103/57  Pulse: 99 95  Resp: 16 20  Temp:  36.5 C  SpO2: 96% 98%    Last Pain:  Vitals:   04/13/19 0906  TempSrc:   PainSc: 0-No pain                 Lynda Rainwater

## 2019-04-13 NOTE — Interval H&P Note (Signed)
History and Physical Interval Note:  04/13/2019 7:21 AM  Brittany Jordan  has presented today for surgery, with the diagnosis of liposarcoma.  The various methods of treatment have been discussed with the patient and family. After consideration of risks, benefits and other options for treatment, the patient has consented to  Procedure(s): EXCISION LEFT LEG LIPOSARCOMA (Left) as a surgical intervention.  The patient's history has been reviewed, patient examined, no change in status, stable for surgery.  I have reviewed the patient's chart and labs.  Questions were answered to the patient's satisfaction.     [place]

## 2019-04-13 NOTE — Transfer of Care (Signed)
Immediate Anesthesia Transfer of Care Note  Patient: Brittany Jordan  Procedure(s) Performed: EXCISION LEFT LEG LIPOSARCOMA (Left Leg Lower)  Patient Location: PACU  Anesthesia Type:General  Level of Consciousness: awake  Airway & Oxygen Therapy: Patient Spontanous Breathing and Patient connected to nasal cannula oxygen  Post-op Assessment: Report given to RN and Post -op Vital signs reviewed and stable  Post vital signs: Reviewed and stable  Last Vitals:  Vitals Value Taken Time  BP 115/67 04/13/2019  8:30 AM  Temp    Pulse 103 04/13/2019  8:30 AM  Resp 23 04/13/2019  8:30 AM  SpO2 100 % 04/13/2019  8:30 AM  Vitals shown include unvalidated device data.  Last Pain:  Vitals:   04/13/19 0637  TempSrc: Oral  PainSc: 0-No pain      Patients Stated Pain Goal: 0 (13/14/38 8875)  Complications: No apparent anesthesia complications

## 2019-04-13 NOTE — H&P (Signed)
Brittany Jordan Documented: Location: [facility] Patient #: [id number] DOB: 1951/06/30 Married / Language: English / Race: White Female  History of Present Illness Patient words: Patient sent at the request of Dr. Brigitte Pulse for left lower extremity mass. The patient had a golf ball sized mass located just above her left knee on the lateral aspect of the lower leg has been present for many years. She stated it began to get larger and therefore excision was recommended. This was done as an office procedure. The patient's pathology returned with diagnosis of possible liposarcoma. Grade was not assigned to this. The patient has done well suture office based procedure. She also has a synovial cyst on her right wrist that she has aspirated from time to time as well. She is a smoker.        Diagnosis Skin (A), left knee mass, excision PRELIMINARY DIAGNOSIS: FINDINGS CONSISTENT WITH LIPOSARCOMA, MARGINS INVOLVED, SEE DESCRIPTION Microscopic Description Sections show mature adipose tissue with scattered markedly atypical cells with enlarged and smudgy nuclear chromatin. Some of these cells are multinucleated and some have a lipoblast-like appearance. The margins are involved. Dr. Raliegh Ip. Brigitte Pulse was informed of the preliminary findings by Dr. Emelia Salisbury on 01/30/2019 at 1:20 p.m. and received consent to order MDM2 testing. An addendum update will follow following MDM2 testing completion. (DH:ah 01/30/19) Shon Millet MD Dermatopathologist, Electronic Signature (Case signed 01/30/2019) Specimen Gross and Clinical Information Clinical Diagnosis Lipoma SPECIMEN(S).  The patient is a 68 year old female.   Past Surgical History  Appendectomy Colon Polyp Removal - Colonoscopy Gallbladder Surgery - Laparoscopic Hysterectomy (due to cancer) - Complete Oral Surgery  Diagnostic Studies History Colonoscopy 1-5 years ago Mammogram within last  year  Allergies Penicillins  Medication History amLODIPine Besylate (5MG  Tablet, Oral) Active. Amitriptyline HCl (25MG  Tablet, Oral) Active. Nystatin-Triamcinolone (100000-0.1UNIT/GM-% Cream, External) Active. Pravastatin Sodium (10MG  Tablet, Oral) Active. Centrum Silver (Oral) Active. Ibuprofen (200MG  Capsule, Oral) Active. Magnesium (200MG  Tablet, Oral) Active. PriLOSEC (20MG  Capsule DR, Oral) Active. Probiotic (250MG  Capsule, Oral) Active. Vitamin D2 (10 MCG(400 UNIT) Tablet, Oral) Active. Medications Reconciled  Social History  Alcohol use Occasional alcohol use. No caffeine use No drug use Tobacco use Current every day smoker.  Family History Alcohol Abuse Brother. Arthritis Father, Mother, Sister. Cerebrovascular Accident Father, Mother. Hypertension Mother. Melanoma Mother.  Pregnancy / Birth History  Age at menarche 68 years. Gravida 0 Para 0  Other Problems  Cholelithiasis Gastroesophageal Reflux Disease High blood pressure Hypercholesterolemia Oophorectomy     Review of Systems  General Not Present- Appetite Loss, Chills, Fatigue, Fever, Night Sweats, Weight Gain and Weight Loss. Skin Not Present- Change in Wart/Mole, Dryness, Hives, Jaundice, New Lesions, Non-Healing Wounds, Rash and Ulcer. HEENT Present- Ringing in the Ears, Seasonal Allergies and Wears glasses/contact lenses. Not Present- Earache, Hearing Loss, Hoarseness, Nose Bleed, Oral Ulcers, Sinus Pain, Sore Throat, Visual Disturbances and Yellow Eyes. Respiratory Not Present- Bloody sputum, Chronic Cough, Difficulty Breathing, Snoring and Wheezing. Breast Not Present- Breast Mass, Breast Pain, Nipple Discharge and Skin Changes. Cardiovascular Not Present- Chest Pain, Difficulty Breathing Lying Down, Leg Cramps, Palpitations, Rapid Heart Rate, Shortness of Breath and Swelling of Extremities. Gastrointestinal Not Present- Abdominal Pain, Bloating, Bloody  Stool, Change in Bowel Habits, Chronic diarrhea, Constipation, Difficulty Swallowing, Excessive gas, Gets full quickly at meals, Hemorrhoids, Indigestion, Nausea, Rectal Pain and Vomiting. Female Genitourinary Not Present- Frequency, Nocturia, Painful Urination, Pelvic Pain and Urgency. Musculoskeletal Not Present- Back Pain, Joint Pain, Joint Stiffness, Muscle Pain, Muscle Weakness and Swelling of  Extremities. Neurological Not Present- Decreased Memory, Fainting, Headaches, Numbness, Seizures, Tingling, Tremor, Trouble walking and Weakness. Psychiatric Not Present- Anxiety, Bipolar, Change in Sleep Pattern, Depression, Fearful and Frequent crying. Endocrine Not Present- Cold Intolerance, Excessive Hunger, Hair Changes, Heat Intolerance, Hot flashes and New Diabetes. Hematology Not Present- Blood Thinners, Easy Bruising, Excessive bleeding, Gland problems, HIV and Persistent Infections. All other systems negative  Vitals  02/06/2019 9:54 AM Weight: 201.38 lb Height: 63in Body Surface Area: 1.94 m Body Mass Index: 35.67 kg/m  Temp.: 97.69F(Oral)  Pulse: 111 (Regular)  BP: 160/80 (Sitting, Left Arm, Standard)      Physical Exam ( General Mental Status-Alert. General Appearance-Consistent with stated age. Hydration-Well hydrated. Voice-Normal.  Head and Neck Head-normocephalic, atraumatic with no lesions or palpable masses. Trachea-midline. Thyroid Gland Characteristics - normal size and consistency.  Eye Eyeball - Bilateral-Extraocular movements intact. Sclera/Conjunctiva - Bilateral-No scleral icterus.  Chest and Lung Exam Chest and lung exam reveals -quiet, even and easy respiratory effort with no use of accessory muscles and on auscultation, normal breath sounds, no adventitious sounds and normal vocal resonance. Inspection Chest Wall - Normal. Back - normal.  Cardiovascular Cardiovascular examination reveals -normal heart  sounds, regular rate and rhythm with no murmurs and normal pedal pulses bilaterally.  Neurologic Neurologic evaluation reveals -alert and oriented x 3 with no impairment of recent or remote memory. Mental Status-Normal.  Musculoskeletal Normal Exam - Left-Upper Extremity Strength Normal and Lower Extremity Strength Normal. Normal Exam - Right-Upper Extremity Strength Normal and Lower Extremity Strength Normal. Note: Left incision noted just above the left knee. Small seroma noted. Full range of motion. No obvious neurological deficits. No residual mass on examination today. This is about 4 cm to the lateral and superior to the patella.    Assessment & Plan   LIPOSARCOMA OF LEFT LOWER EXTREMITY (C49.22) Impression: left lateral knee  Refer to medical and radiation oncology.  Obtain preoperative MRI of left upper extremity for surgical management.  Discussed treatment options here or referral to a tertiary care center if the patient so desired. The l pros and cons of surgery. She will require reexcision given her positive margin in. Discussed referral to a tertiary care center as well explained to her that she may require this depending on her margin status after excision. Her goals would be to get a microscopic and grossly negative margins circumstance. Discussed the role of medical and radiation oncology as well. Discussed potential functional limitations afterwards the potential need further treatments and/or aggressive surgery.. She like to proceed with treatment here for now understanding that she may require further treatment elsewhere and/or further treatments in general.  Risk of bleeding, infection, limb loss, limb dysfunction, the need for further treatments, blood clot, stroke, DVT, death  Current Plans Pt Education - [facility] Free Text Education/Instructions: discussed with patient and provided information. Pt Education - [facility] STOP SMOKING! Pt Education - [facility] -  General recommendations

## 2019-04-13 NOTE — Anesthesia Preprocedure Evaluation (Signed)
Anesthesia Evaluation    History of Anesthesia Complications (+) PONV  Airway Mallampati: II  TM Distance: >3 FB Neck ROM: Full    Dental no notable dental hx.    Pulmonary COPD, Current Smoker,    Pulmonary exam normal breath sounds clear to auscultation       Cardiovascular hypertension, Pt. on medications Normal cardiovascular exam Rhythm:Regular Rate:Normal     Neuro/Psych Anxiety Depression    GI/Hepatic GERD  ,  Endo/Other    Renal/GU      Musculoskeletal   Abdominal (+) + obese,   Peds  Hematology   Anesthesia Other Findings   Reproductive/Obstetrics                             Anesthesia Physical Anesthesia Plan  ASA: II  Anesthesia Plan: General   Post-op Pain Management:    Induction: Intravenous  PONV Risk Score and Plan: 3 and Ondansetron, Dexamethasone, Midazolam and Treatment may vary due to age or medical condition  Airway Management Planned: LMA  Additional Equipment:   Intra-op Plan:   Post-operative Plan: Extubation in OR  Informed Consent: I have reviewed the patients History and Physical, chart, labs and discussed the procedure including the risks, benefits and alternatives for the proposed anesthesia with the patient or authorized representative who has indicated his/her understanding and acceptance.     Dental advisory given  Plan Discussed with: CRNA  Anesthesia Plan Comments:         Anesthesia Quick Evaluation

## 2019-04-13 NOTE — Op Note (Signed)
Preoperative diagnosis: 4 cm left thigh liposarcoma subcutaneous  Postop diagnosis: Same  Procedure: Excision left thigh 4 cm liposarcoma  Surgeon: Thomas Cornett, MD  Anesthesia: LMA with 0.25% Sensorcaine local with epinephrine  EBL: Minimal  Specimen: Margins of left thigh liposarcoma bed excised and oriented and sent to pathology  Drains: None  EBL: Minimal  Indications for procedure: The patient presents for excision of left thigh liposarcoma.  She had a mass excised in March 2020 by her primary care physician that was felt to be a lipoma but the final pathology showed atypical proliferation concerning for possible low-grade liposarcoma.  The margins were positive therefore she presents for excision of the margins of this 4 cm region to ensure that they were cleared and to see if any adjuvant therapy would be helpful.  Risk, benefits and other options discussed with the patient.  Observation discussed given his low-grade nature as well but since margin status was unclear recommend excision to at least reevaluate the margins to see if radiation therapy would be helpful if she had positive margins or if observation more appropriate.  The risk of COVID discussed.  The procedure has been discussed with the patient.  Alternative therapies have been discussed with the patient.  Operative risks include bleeding,  Infection,  Organ injury,  Nerve injury,  Blood vessel injury,  DVT,  Pulmonary embolism,  Death,  And possible reoperation.  Medical management risks include worsening of present situation.  The success of the procedure is 50 -90 % at treating patients symptoms.  The patient understands and agrees to proceed.     Description of procedure: The patient was met in the holding area.  The left thigh incision from previous excision marked questions answered.  The patient was taken back to the operating.  She is placed supine upon the OR table.  After induction of LMA anesthesia, left thigh  was prepped and draped in sterile fashion timeout was done.  The previous scar was marked with a marking pen.  Local anesthetic was infiltrated.  Curvilinear incision was made around the old scar site.  I excised an anterior, posterior, medial, lateral, superior and inferior margins.  These were all more excised independent of each other and oriented with ink.  There is no obvious any gross disease and there is no muscular involvement that I could see.  Of note, preoperative MRI was obtained which showed no residual disease either.  The wound was irrigated.  It was closed with 3-0 Vicryl and 4-0 Monocryl.  Sized wound was 4 cm x 1 cm and the area excised was all subcutaneous.  Dermabond applied.  Ace wrap placed.  All final counts found to be correct.  Patient was awoke extubated taken to recovery in satisfactory condition. 

## 2019-04-14 ENCOUNTER — Encounter (HOSPITAL_BASED_OUTPATIENT_CLINIC_OR_DEPARTMENT_OTHER): Payer: Self-pay | Admitting: Surgery

## 2019-04-17 ENCOUNTER — Encounter: Payer: Self-pay | Admitting: Radiation Oncology

## 2019-04-17 ENCOUNTER — Ambulatory Visit
Admission: RE | Admit: 2019-04-17 | Discharge: 2019-04-17 | Disposition: A | Discharge status: Home / Self Care | Payer: Medicare HMO | Source: Ambulatory Visit | Attending: Radiation Oncology | Admitting: Radiation Oncology

## 2019-04-17 ENCOUNTER — Other Ambulatory Visit: Payer: Self-pay

## 2019-04-17 VITALS — BP 157/80 | HR 98 | Temp 98.5°F | Resp 18 | Ht 63.0 in | Wt 205.1 lb

## 2019-04-17 DIAGNOSIS — C4922 Malignant neoplasm of connective and soft tissue of left lower limb, including hip: Secondary | ICD-10-CM | POA: Insufficient documentation

## 2019-04-17 DIAGNOSIS — D1724 Benign lipomatous neoplasm of skin and subcutaneous tissue of left leg: Secondary | ICD-10-CM | POA: Diagnosis not present

## 2019-04-17 DIAGNOSIS — Z79899 Other long term (current) drug therapy: Secondary | ICD-10-CM | POA: Diagnosis not present

## 2019-04-17 DIAGNOSIS — Z9889 Other specified postprocedural states: Secondary | ICD-10-CM | POA: Diagnosis not present

## 2019-04-17 NOTE — Progress Notes (Signed)
Radiation Oncology         [phone] ________________________________  Name: Brittany Jordan MRN: [id number]  Date: 04/17/2019  DOB: 01/21/1951  Reevaluation Visit Note  CC: Mayra Neer, MD  Mayra Neer, MD    ICD-10-CM   1. Liposarcoma of thigh, left (HCC) C49.22     Diagnosis:   Atypical lipomatous tumor presenting in the left lower extremity  Narrative:  The patient returns today for reevaluation.  she is doing well overall. She was last seen in the clinic on 03/09/19. Since then she underwent reexcision surgery with Dr. Brantley Stage.on 04/13/19. Pathology from the procedure revealed skin and underlying soft tissue with nonspecific post-procedural changes including foreign body granulomas, but no evidence of malignancy.                Patient reports no pain within the surgical bed or problems with swelling in her left lower extremity                 ALLERGIES:  is allergic to penicillins.  Meds: Current Outpatient Medications  Medication Sig Dispense Refill  . amitriptyline (ELAVIL) 25 MG tablet TAKE 1 TO 2 TABLETS BY MOUTH EVERY DAY AT BEDTIME.    Marland Kitchen amLODipine (NORVASC) 5 MG tablet TAKE 1 TABLET BY MOUTH EVERYDAY AT BEDTIME    . ibuprofen (ADVIL) 800 MG tablet Take 1 tablet (800 mg total) by mouth every 8 (eight) hours as needed. 30 tablet 0  . loratadine-pseudoephedrine (CLARITIN-D 24-HOUR) 10-240 MG per 24 hr tablet Take 1 tablet by mouth daily as needed.     . Magnesium 200 MG TABS Take by mouth.    . Multiple Vitamins-Minerals (CENTRUM SILVER 50+WOMEN) TABS Take by mouth.    . nystatin-triamcinolone (MYCOLOG II) cream     . omeprazole (PRILOSEC) 20 MG capsule Take 20 mg by mouth daily.      . pravastatin (PRAVACHOL) 10 MG tablet Take 10 mg by mouth daily.    . Turmeric 500 MG CAPS Take by mouth.    . Vitamin D, Cholecalciferol, 10 MCG (400 UNIT) TABS Take by mouth.    Marland Kitchen ibuprofen (ADVIL,MOTRIN) 200 MG tablet Take 200 mg by mouth every 6 (six) hours as needed.    .  varenicline (CHANTIX) 1 MG tablet Take 1 mg by mouth 2 (two) times daily.       No current facility-administered medications for this encounter.     Physical Findings: The patient is in no acute distress. Patient is alert and oriented.  height is 5\' 3"  (1.6 m) and weight is 205 lb 2 oz (93 kg). Her temporal temperature is 98.5 F (36.9 C). Her blood pressure is 157/80 (abnormal) and her pulse is 98. Her respiration is 18 and oxygen saturation is 99%. .  Lungs are clear to auscultation bilaterally. Heart has regular rate and rhythm. No palpable cervical, supraclavicular, or axillary adenopathy. Abdomen soft, non-tender, normal bowel sounds.  Examination of left lower extremity reveals a scar proximately 4-5 cm in length just above the knee.  This is healing well without signs of drainage or infection.  No significant swelling noted in the calf or foot area.     Lab Findings: Lab Results  Component Value Date   WBC 8.3 04/11/2019   HGB 16.6 (H) 04/11/2019   HCT 49.8 (H) 04/11/2019   MCV 90.9 04/11/2019   PLT 359 04/11/2019    Radiographic Findings: No results found.  Impression: Atypical lipomatous tumor presenting in the left lower extremity.  Originally the patient was thought to possibly have a liposarcoma.  Initial surgery showed positive margins, she then proceeded to undergo wide reexcision with no residual atypical changes or signs of sarcoma.  Given the pathologic findings there are no indications for postop radiation therapy.  Patient is pleased to hear these results.  I did provide her with a copy of her original path report and the most recent surgery by Dr. Brantley Stage.  Plan: PRN follow-up in radiation oncology.  Patient will continue follow-up with Dr. Brantley Stage.  ____________________________________   Blair Promise, PhD, MD    This document serves as a record of services personally performed by Gery Pray, MD. It was created on his behalf by Mary-Margaret Loma Messing, a  trained medical scribe. The creation of this record is based on the scribe's personal observations and the provider's statements to them. This document has been checked and approved by the attending provider.

## 2019-04-17 NOTE — Patient Instructions (Signed)
Coronavirus (COVID-19) Are you at risk?  Are you at risk for the Coronavirus (COVID-19)?  To be considered HIGH RISK for Coronavirus (COVID-19), you have to meet the following criteria:  . Traveled to [place], [place], [place], [place] or [place]; or in the [place] to [place], [place], [place], or [place]; and have fever, cough, and shortness of breath within the last 2 weeks of travel OR . Been in close contact with a person diagnosed with COVID-19 within the last 2 weeks and have fever, cough, and shortness of breath . IF YOU DO NOT MEET THESE CRITERIA, YOU ARE CONSIDERED LOW RISK FOR COVID-19.  What to do if you are HIGH RISK for COVID-19?  . If you are having a medical emergency, call 911. . Seek medical care right away. Before you go to a doctor's office, urgent care or emergency department, call ahead and tell them about your recent travel, contact with someone diagnosed with COVID-19, and your symptoms. You should receive instructions from your physician's office regarding next steps of care.  . When you arrive at healthcare provider, tell the healthcare staff immediately you have returned from visiting [place], [place], [place], [place] or [place]; or traveled in the [place] to [place], [place], [place], or [place]; in the last two weeks or you have been in close contact with a person diagnosed with COVID-19 in the last 2 weeks.   . Tell the health care staff about your symptoms: fever, cough and shortness of breath. . After you have been seen by a medical provider, you will be either: o Tested for (COVID-19) and discharged home on quarantine except to seek medical care if symptoms worsen, and asked to  - Stay home and avoid contact with others until you get your results (4-5 days)  - Avoid travel on public transportation if possible (such as bus, train, or airplane) or o Sent to the Emergency Department by EMS for evaluation, COVID-19 testing, and possible  admission depending on your condition and test results.  What to do if you are LOW RISK for COVID-19?  Reduce your risk of any infection by using the same precautions used for avoiding the common cold or flu:  . Wash your hands often with soap and warm water for at least 20 seconds.  If soap and water are not readily available, use an alcohol-based hand sanitizer with at least 60% alcohol.  . If coughing or sneezing, cover your mouth and nose by coughing or sneezing into the elbow areas of your shirt or coat, into a tissue or into your sleeve (not your hands). . Avoid shaking hands with others and consider head nods or verbal greetings only. . Avoid touching your eyes, nose, or mouth with unwashed hands.  . Avoid close contact with people who are sick. . Avoid places or events with large numbers of people in one location, like concerts or sporting events. . Carefully consider travel plans you have or are making. . If you are planning any travel outside or inside the [place], visit the CDC's Travelers' Health webpage for the latest health notices. . If you have some symptoms but not all symptoms, continue to monitor at home and seek medical attention if your symptoms worsen. . If you are having a medical emergency, call 911.   ADDITIONAL HEALTHCARE OPTIONS FOR PATIENTS  [facility] / e-Visit: [email or web address]         [facility]: [phone]  [facility]: [phone]                   [facility]: [phone]   

## 2019-06-16 DIAGNOSIS — R8290 Unspecified abnormal findings in urine: Secondary | ICD-10-CM | POA: Diagnosis not present

## 2019-06-16 DIAGNOSIS — R300 Dysuria: Secondary | ICD-10-CM | POA: Diagnosis not present

## 2019-06-21 DIAGNOSIS — D124 Benign neoplasm of descending colon: Secondary | ICD-10-CM | POA: Diagnosis not present

## 2019-06-21 DIAGNOSIS — Z86010 Personal history of colonic polyps: Secondary | ICD-10-CM | POA: Diagnosis not present

## 2019-06-21 DIAGNOSIS — D125 Benign neoplasm of sigmoid colon: Secondary | ICD-10-CM | POA: Diagnosis not present

## 2019-06-21 DIAGNOSIS — K635 Polyp of colon: Secondary | ICD-10-CM | POA: Diagnosis not present

## 2019-06-21 DIAGNOSIS — D122 Benign neoplasm of ascending colon: Secondary | ICD-10-CM | POA: Diagnosis not present

## 2019-08-24 DIAGNOSIS — R69 Illness, unspecified: Secondary | ICD-10-CM | POA: Diagnosis not present

## 2019-09-14 DIAGNOSIS — R519 Headache, unspecified: Secondary | ICD-10-CM | POA: Diagnosis not present

## 2019-09-14 DIAGNOSIS — R05 Cough: Secondary | ICD-10-CM | POA: Diagnosis not present

## 2019-09-15 ENCOUNTER — Other Ambulatory Visit: Payer: Self-pay

## 2019-09-15 DIAGNOSIS — Z20822 Contact with and (suspected) exposure to covid-19: Secondary | ICD-10-CM

## 2019-09-16 LAB — NOVEL CORONAVIRUS, NAA: SARS-CoV-2, NAA: NOT DETECTED

## 2019-11-21 ENCOUNTER — Ambulatory Visit: Payer: Medicare HMO | Attending: Internal Medicine

## 2019-12-08 DIAGNOSIS — Z20822 Contact with and (suspected) exposure to covid-19: Secondary | ICD-10-CM | POA: Diagnosis not present

## 2019-12-13 ENCOUNTER — Ambulatory Visit: Payer: Medicare HMO | Attending: Internal Medicine

## 2019-12-13 DIAGNOSIS — Z20822 Contact with and (suspected) exposure to covid-19: Secondary | ICD-10-CM

## 2019-12-14 LAB — NOVEL CORONAVIRUS, NAA: SARS-CoV-2, NAA: NOT DETECTED

## 2019-12-21 DIAGNOSIS — Z720 Tobacco use: Secondary | ICD-10-CM | POA: Diagnosis not present

## 2019-12-21 DIAGNOSIS — J449 Chronic obstructive pulmonary disease, unspecified: Secondary | ICD-10-CM | POA: Diagnosis not present

## 2019-12-21 DIAGNOSIS — K219 Gastro-esophageal reflux disease without esophagitis: Secondary | ICD-10-CM | POA: Diagnosis not present

## 2019-12-21 DIAGNOSIS — E669 Obesity, unspecified: Secondary | ICD-10-CM | POA: Diagnosis not present

## 2019-12-21 DIAGNOSIS — M1990 Unspecified osteoarthritis, unspecified site: Secondary | ICD-10-CM | POA: Diagnosis not present

## 2019-12-21 DIAGNOSIS — E7800 Pure hypercholesterolemia, unspecified: Secondary | ICD-10-CM | POA: Diagnosis not present

## 2019-12-21 DIAGNOSIS — R69 Illness, unspecified: Secondary | ICD-10-CM | POA: Diagnosis not present

## 2019-12-21 DIAGNOSIS — Z0000 Encounter for general adult medical examination without abnormal findings: Secondary | ICD-10-CM | POA: Diagnosis not present

## 2019-12-21 DIAGNOSIS — G4700 Insomnia, unspecified: Secondary | ICD-10-CM | POA: Diagnosis not present

## 2019-12-21 DIAGNOSIS — I10 Essential (primary) hypertension: Secondary | ICD-10-CM | POA: Diagnosis not present

## 2020-01-02 DIAGNOSIS — E7800 Pure hypercholesterolemia, unspecified: Secondary | ICD-10-CM | POA: Diagnosis not present

## 2020-01-08 DIAGNOSIS — Z1231 Encounter for screening mammogram for malignant neoplasm of breast: Secondary | ICD-10-CM | POA: Diagnosis not present

## 2020-01-08 DIAGNOSIS — Z780 Asymptomatic menopausal state: Secondary | ICD-10-CM | POA: Diagnosis not present

## 2020-01-08 DIAGNOSIS — M85852 Other specified disorders of bone density and structure, left thigh: Secondary | ICD-10-CM | POA: Diagnosis not present

## 2020-01-08 DIAGNOSIS — M85851 Other specified disorders of bone density and structure, right thigh: Secondary | ICD-10-CM | POA: Diagnosis not present

## 2020-01-15 ENCOUNTER — Ambulatory Visit: Payer: Medicare HMO | Attending: Internal Medicine

## 2020-01-15 DIAGNOSIS — Z23 Encounter for immunization: Secondary | ICD-10-CM | POA: Insufficient documentation

## 2020-01-15 NOTE — Progress Notes (Signed)
   Covid-19 Vaccination Clinic  Name:  Brittany Jordan    MRN: [id number] DOB: 25-Mar-1951  01/15/2020  [name] was observed post Covid-19 immunization for 15 minutes without incidence. She was provided with Vaccine Information Sheet and instruction to access the V-Safe system.   [name] was instructed to call 911 with any severe reactions post vaccine: Marland Kitchen Difficulty breathing  . Swelling of your face and throat  . A fast heartbeat  . A bad rash all over your body  . Dizziness and weakness    Immunizations Administered    Name Date Dose VIS Date Route   Pfizer COVID-19 Vaccine 01/15/2020 12:31 PM 0.3 mL 10/27/2019 Intramuscular   Manufacturer: [place]   Lot: [id number]   [place]: [id number]

## 2020-02-02 DIAGNOSIS — H524 Presbyopia: Secondary | ICD-10-CM | POA: Diagnosis not present

## 2020-02-02 DIAGNOSIS — A0472 Enterocolitis due to Clostridium difficile, not specified as recurrent: Secondary | ICD-10-CM | POA: Diagnosis not present

## 2020-02-02 DIAGNOSIS — Z0100 Encounter for examination of eyes and vision without abnormal findings: Secondary | ICD-10-CM | POA: Diagnosis not present

## 2020-02-02 DIAGNOSIS — R69 Illness, unspecified: Secondary | ICD-10-CM | POA: Diagnosis not present

## 2020-02-02 DIAGNOSIS — M4981 Spondylopathy in diseases classified elsewhere, occipito-atlanto-axial region: Secondary | ICD-10-CM | POA: Diagnosis not present

## 2020-02-13 ENCOUNTER — Ambulatory Visit: Payer: Medicare HMO | Attending: Internal Medicine

## 2020-02-13 DIAGNOSIS — Z23 Encounter for immunization: Secondary | ICD-10-CM

## 2020-02-13 NOTE — Progress Notes (Signed)
   Covid-19 Vaccination Clinic  Name:  Brittany Jordan    MRN: [id number] DOB: 1951/09/17  02/13/2020  [name] was observed post Covid-19 immunization for 15 minutes without incident. She was provided with Vaccine Information Sheet and instruction to access the V-Safe system.   [name] was instructed to call 911 with any severe reactions post vaccine: Marland Kitchen Difficulty breathing  . Swelling of face and throat  . A fast heartbeat  . A bad rash all over body  . Dizziness and weakness   Immunizations Administered    Name Date Dose VIS Date Route   Pfizer COVID-19 Vaccine 02/13/2020 12:34 PM 0.3 mL 10/27/2019 Intramuscular   Manufacturer: [place]   Lot: [id number]   [place]: [id number]

## 2020-02-26 DIAGNOSIS — R69 Illness, unspecified: Secondary | ICD-10-CM | POA: Diagnosis not present

## 2020-02-26 DIAGNOSIS — M25512 Pain in left shoulder: Secondary | ICD-10-CM | POA: Diagnosis not present

## 2020-03-27 DIAGNOSIS — M542 Cervicalgia: Secondary | ICD-10-CM | POA: Diagnosis not present

## 2020-03-27 DIAGNOSIS — M25512 Pain in left shoulder: Secondary | ICD-10-CM | POA: Diagnosis not present

## 2020-04-02 DIAGNOSIS — M25512 Pain in left shoulder: Secondary | ICD-10-CM | POA: Diagnosis not present

## 2020-04-02 DIAGNOSIS — M542 Cervicalgia: Secondary | ICD-10-CM | POA: Diagnosis not present

## 2020-04-08 ENCOUNTER — Other Ambulatory Visit: Payer: Self-pay

## 2020-04-08 ENCOUNTER — Ambulatory Visit (INDEPENDENT_AMBULATORY_CARE_PROVIDER_SITE_OTHER): Payer: Medicare HMO

## 2020-04-08 ENCOUNTER — Ambulatory Visit (INDEPENDENT_AMBULATORY_CARE_PROVIDER_SITE_OTHER): Payer: Medicare HMO | Admitting: Sports Medicine

## 2020-04-08 DIAGNOSIS — M5030 Other cervical disc degeneration, unspecified cervical region: Secondary | ICD-10-CM

## 2020-04-08 DIAGNOSIS — M542 Cervicalgia: Secondary | ICD-10-CM | POA: Diagnosis not present

## 2020-04-08 DIAGNOSIS — M67431 Ganglion, right wrist: Secondary | ICD-10-CM

## 2020-04-08 DIAGNOSIS — M47812 Spondylosis without myelopathy or radiculopathy, cervical region: Secondary | ICD-10-CM | POA: Diagnosis not present

## 2020-04-08 MED ORDER — PREDNISONE 10 MG (48) PO TBPK: ORAL_TABLET | Freq: Every day | ORAL | 0 refills | Status: DC

## 2020-04-08 MED ORDER — GABAPENTIN 300 MG PO CAPS: ORAL_CAPSULE | ORAL | 3 refills | Status: DC

## 2020-04-08 NOTE — Progress Notes (Signed)
    Procedures performed today:    None.  Independent interpretation of notes and tests performed by another provider:   I personally reviewed a CT scan of her chest from approximately a decade ago, she does have significant degenerative disc disease worst at the C5-C6 level.  Brief History, Exam, Impression, and Recommendations:    DDD (degenerative disc disease), cervical This is a very pleasant 69 year old female, for some time now she is had pain in her neck with radiation into the left trapezius, nothing down the arm. She is had some formal physical therapy with dry needling, she also had a burst of prednisone that provided some moderate relief in the past. Overall her symptoms have improved, but she continues to have discomfort with prolonged downgaze. No progressive weakness, no constitutional symptoms, she does have a history of surgical excision of a soft tissue sarcoma/cancer. In addition she has failure of greater than 6 weeks of conservative measures. We are going to continue additional conservative treatment in the form of 12-day prednisone taper followed by gabapentin and a slow up taper, cervical spine x-rays and considering her history of cancer we are going to proceed with MRI without contrast of her cervical spine today. I am going to give her additional cervical spine rehab exercises to do at home and I'd like to see her back in approximately 3 to 4 weeks.  Ganglion cyst of dorsum of right wrist Minimally symptomatic, she does have occasional aspirations of the ganglion cyst by her PCP. They can certainly continue this, if she is in the area I am happy to do it as well, we would add steroid after the aspiration, he does understand that there is a 50% recurrence with aspirations and injections.    ___________________________________________ Gwen Her. Dianah Field, M.D., ABFM., CAQSM. Primary Care and [place]  Instructor of [place] of [facility] of Medicine

## 2020-04-08 NOTE — Assessment & Plan Note (Addendum)
Minimally symptomatic, she does have occasional aspirations of the ganglion cyst by her PCP. They can certainly continue this, if she is in the area I am happy to do it as well, we would add steroid after the aspiration, he does understand that there is a 50% recurrence with aspirations and injections.

## 2020-04-08 NOTE — Assessment & Plan Note (Addendum)
This is a very pleasant 69 year old female, for some time now she is had pain in her neck with radiation into the left trapezius, nothing down the arm. She is had some formal physical therapy with dry needling, she also had a burst of prednisone that provided some moderate relief in the past. Overall her symptoms have improved, but she continues to have discomfort with prolonged downgaze. No progressive weakness, no constitutional symptoms, she does have a history of surgical excision of a soft tissue sarcoma/cancer. In addition she has failure of greater than 6 weeks of conservative measures. We are going to continue additional conservative treatment in the form of 12-day prednisone taper followed by gabapentin and a slow up taper, cervical spine x-rays and considering her history of cancer we are going to proceed with MRI without contrast of her cervical spine today. I am going to give her additional cervical spine rehab exercises to do at home and I'd like to see her back in approximately 3 to 4 weeks.

## 2020-04-14 ENCOUNTER — Other Ambulatory Visit: Payer: Self-pay

## 2020-04-14 ENCOUNTER — Ambulatory Visit (INDEPENDENT_AMBULATORY_CARE_PROVIDER_SITE_OTHER): Payer: Medicare HMO

## 2020-04-14 DIAGNOSIS — M542 Cervicalgia: Secondary | ICD-10-CM

## 2020-04-14 DIAGNOSIS — M5030 Other cervical disc degeneration, unspecified cervical region: Secondary | ICD-10-CM

## 2020-04-16 DIAGNOSIS — M5030 Other cervical disc degeneration, unspecified cervical region: Secondary | ICD-10-CM

## 2020-04-16 DIAGNOSIS — C4922 Malignant neoplasm of connective and soft tissue of left lower limb, including hip: Secondary | ICD-10-CM | POA: Diagnosis not present

## 2020-04-16 MED ORDER — HYDROCODONE-ACETAMINOPHEN 10-325 MG PO TABS: 1.0000 | ORAL_TABLET | Freq: Three times a day (TID) | ORAL | 0 refills | Status: DC | PRN

## 2020-04-16 NOTE — Assessment & Plan Note (Signed)
Brittany Jordan has fairly severe cervical spinal stenosis, adding a left-sided C6-C7 interlaminar epidural, high-dose hydrocodone. Return to see me 1 month after the injection.

## 2020-04-16 NOTE — Telephone Encounter (Signed)
LMOM for Brittany Jordan at [facility].

## 2020-04-16 NOTE — Telephone Encounter (Signed)
Please contact [facility] for scheduling 

## 2020-04-22 ENCOUNTER — Other Ambulatory Visit: Payer: Self-pay | Admitting: *Deleted

## 2020-04-22 DIAGNOSIS — M5030 Other cervical disc degeneration, unspecified cervical region: Secondary | ICD-10-CM

## 2020-04-22 MED ORDER — HYDROCODONE-ACETAMINOPHEN 10-325 MG PO TABS: 1.0000 | ORAL_TABLET | Freq: Three times a day (TID) | ORAL | 0 refills | Status: DC | PRN

## 2020-04-23 ENCOUNTER — Telehealth: Payer: Self-pay | Admitting: Sports Medicine

## 2020-04-23 NOTE — Telephone Encounter (Signed)
Approved auth # 04/23/20-10/20/20, [id number]

## 2020-04-25 ENCOUNTER — Ambulatory Visit
Admission: RE | Admit: 2020-04-25 | Discharge: 2020-04-25 | Disposition: A | Discharge status: Home / Self Care | Payer: Medicare HMO | Source: Ambulatory Visit | Attending: Sports Medicine | Admitting: Sports Medicine

## 2020-04-25 DIAGNOSIS — M542 Cervicalgia: Secondary | ICD-10-CM | POA: Diagnosis not present

## 2020-04-25 DIAGNOSIS — M5030 Other cervical disc degeneration, unspecified cervical region: Secondary | ICD-10-CM

## 2020-04-25 MED ORDER — IOPAMIDOL (ISOVUE-M 300) INJECTION 61%
1.0000 mL | Freq: Once | INTRAMUSCULAR | Status: AC
  Administered 2020-04-25: 1 mL via EPIDURAL

## 2020-04-25 MED ORDER — IOPAMIDOL (ISOVUE-M 300) INJECTION 61%: 1.0000 mL | Freq: Once | INTRAMUSCULAR | Status: DC | PRN

## 2020-04-25 MED ORDER — TRIAMCINOLONE ACETONIDE 40 MG/ML IJ SUSP (RADIOLOGY)
60.0000 mg | Freq: Once | INTRAMUSCULAR | Status: AC
  Administered 2020-04-25: 60 mg via EPIDURAL

## 2020-04-25 NOTE — Discharge Instructions (Signed)
Spinal Injection Discharge Instruction Sheet  1. You may resume a regular diet and any medications that you routinely take, including pain medications.  2. No driving the rest of the day of the procedure.  3. Light activity throughout the rest of the day.  Do not do any strenuous work, exercise, bending or lifting.  The day following the procedure, you may resume normal physical activity but you should refrain from exercising or physical therapy for at least three days.   Common Side Effects:   Headaches- take your usual medications as directed by your physician.     Restlessness or inability to sleep- you may have trouble sleeping for the next few days.  Ask your referring physician if you need any medication for sleep if over the counter sleep medications do not help.   Facial flushing or redness- this should subside within a few days.   Increased pain- a temporary increase in pain a day or two following your procedure is not unusual.  Take your pain medication as prescribed by your referring physician.  You may use ice to the injection site as needed.  Please do not use heat for 24 hours.   Leg cramps  Please contact our office at [phone] for the following symptoms:  Fever greater than 100 degrees.  Headaches unresolved with medication after 2-3 days.  Increased swelling, pain, or redness at injection site.  Thank you for visiting our office.   You may resume Excedrine today.

## 2020-05-06 ENCOUNTER — Ambulatory Visit: Payer: Medicare HMO | Admitting: Sports Medicine

## 2020-05-23 ENCOUNTER — Ambulatory Visit (INDEPENDENT_AMBULATORY_CARE_PROVIDER_SITE_OTHER): Payer: Medicare HMO | Admitting: Sports Medicine

## 2020-05-23 ENCOUNTER — Other Ambulatory Visit: Payer: Self-pay

## 2020-05-23 DIAGNOSIS — M5030 Other cervical disc degeneration, unspecified cervical region: Secondary | ICD-10-CM

## 2020-05-23 NOTE — Assessment & Plan Note (Signed)
This is a very pleasant 69 year old female with fairly severe cervical spinal stenosis, she failed physical therapy, steroids, NSAIDs, gabapentin. At the last visit we ended up proceeding with a left-sided C6-C7 cervical interlaminar epidural injection, she returns today completely pain-free, return to see me as needed.

## 2020-05-23 NOTE — Progress Notes (Signed)
    Procedures performed today:    None.  Independent interpretation of notes and tests performed by another provider:   None.  Brief History, Exam, Impression, and Recommendations:    DDD (degenerative disc disease), cervical This is a very pleasant 69 year old female with fairly severe cervical spinal stenosis, she failed physical therapy, steroids, NSAIDs, gabapentin. At the last visit we ended up proceeding with a left-sided C6-C7 cervical interlaminar epidural injection, she returns today completely pain-free, return to see me as needed.    ___________________________________________ Gwen Her. Dianah Field, M.D., ABFM., CAQSM. Primary Care and [place] Instructor of [place] of [facility] of Medicine

## 2020-06-21 DIAGNOSIS — R609 Edema, unspecified: Secondary | ICD-10-CM | POA: Diagnosis not present

## 2020-06-21 DIAGNOSIS — R8290 Unspecified abnormal findings in urine: Secondary | ICD-10-CM | POA: Diagnosis not present

## 2020-06-21 DIAGNOSIS — M545 Low back pain: Secondary | ICD-10-CM | POA: Diagnosis not present

## 2020-07-03 ENCOUNTER — Encounter (INDEPENDENT_AMBULATORY_CARE_PROVIDER_SITE_OTHER): Payer: Medicare HMO

## 2020-07-03 DIAGNOSIS — M5030 Other cervical disc degeneration, unspecified cervical region: Secondary | ICD-10-CM

## 2020-07-03 MED ORDER — GABAPENTIN 300 MG PO CAPS: ORAL_CAPSULE | ORAL | 3 refills | Status: AC

## 2020-07-03 NOTE — Telephone Encounter (Signed)
Epidural ordered, please contact [facility] for scheduling. 

## 2020-07-04 MED ORDER — HYDROCODONE-ACETAMINOPHEN 10-325 MG PO TABS: 1.0000 | ORAL_TABLET | Freq: Three times a day (TID) | ORAL | 0 refills | Status: DC | PRN

## 2020-07-09 ENCOUNTER — Ambulatory Visit: Payer: Medicare HMO | Admitting: Sports Medicine

## 2020-07-10 DIAGNOSIS — R609 Edema, unspecified: Secondary | ICD-10-CM | POA: Diagnosis not present

## 2020-07-12 DIAGNOSIS — R609 Edema, unspecified: Secondary | ICD-10-CM | POA: Diagnosis not present

## 2020-07-15 ENCOUNTER — Ambulatory Visit
Admission: RE | Admit: 2020-07-15 | Discharge: 2020-07-15 | Disposition: A | Discharge status: Home / Self Care | Payer: Medicare HMO | Source: Ambulatory Visit | Attending: Sports Medicine | Admitting: Sports Medicine

## 2020-07-15 ENCOUNTER — Other Ambulatory Visit: Payer: Self-pay

## 2020-07-15 DIAGNOSIS — M47812 Spondylosis without myelopathy or radiculopathy, cervical region: Secondary | ICD-10-CM | POA: Diagnosis not present

## 2020-07-15 DIAGNOSIS — M5030 Other cervical disc degeneration, unspecified cervical region: Secondary | ICD-10-CM

## 2020-07-15 MED ORDER — TRIAMCINOLONE ACETONIDE 40 MG/ML IJ SUSP (RADIOLOGY)
60.0000 mg | Freq: Once | INTRAMUSCULAR | Status: AC
  Administered 2020-07-15: 60 mg via EPIDURAL

## 2020-07-15 MED ORDER — IOPAMIDOL (ISOVUE-M 300) INJECTION 61%
1.0000 mL | Freq: Once | INTRAMUSCULAR | Status: AC | PRN
  Administered 2020-07-15: 1 mL via EPIDURAL

## 2020-07-15 NOTE — Discharge Instructions (Signed)

## 2020-08-12 ENCOUNTER — Encounter: Payer: Self-pay | Admitting: Sports Medicine

## 2020-08-12 ENCOUNTER — Other Ambulatory Visit: Payer: Self-pay

## 2020-08-12 ENCOUNTER — Ambulatory Visit (INDEPENDENT_AMBULATORY_CARE_PROVIDER_SITE_OTHER): Payer: Medicare HMO | Admitting: Sports Medicine

## 2020-08-12 DIAGNOSIS — R69 Illness, unspecified: Secondary | ICD-10-CM | POA: Diagnosis not present

## 2020-08-12 DIAGNOSIS — M5030 Other cervical disc degeneration, unspecified cervical region: Secondary | ICD-10-CM

## 2020-08-12 DIAGNOSIS — F17200 Nicotine dependence, unspecified, uncomplicated: Secondary | ICD-10-CM

## 2020-08-12 MED ORDER — HYDROCODONE-ACETAMINOPHEN 10-325 MG PO TABS: 1.0000 | ORAL_TABLET | Freq: Three times a day (TID) | ORAL | 0 refills | Status: DC | PRN

## 2020-08-12 NOTE — Progress Notes (Addendum)
    Procedures performed today:    None.  Independent interpretation of notes and tests performed by another provider:   None.  Brief History, Exam, Impression, and Recommendations:    DDD (degenerative disc disease), cervical This is a pleasant 69 year old female with fairly severe spinal stenosis from C4-C6, she does have some disease at the adjacent levels as well. She has done all of the appropriate conservative treatment including therapy and 2 cervical epidurals that have only provided a few weeks of relief, albeit good relief. At this point I do think she is a candidate for at least a to possibly a 3 level ACDF. I would like Dr. Lynann Bologna to weigh in here.  SMOKER Julieanna unfortunately does still smoke, she will discuss this with her PCP, I informed her of the importance of smoking cessation prior to a cervical fusion type surgery.    ___________________________________________ Gwen Her. Dianah Field, M.D., ABFM., CAQSM. Primary Care and [place] Instructor of [place] of [facility] of Medicine

## 2020-08-12 NOTE — Assessment & Plan Note (Signed)
Brittany Jordan unfortunately does still smoke, she will discuss this with her PCP, I informed her of the importance of smoking cessation prior to a cervical fusion type surgery.

## 2020-08-12 NOTE — Assessment & Plan Note (Signed)
This is a pleasant 69 year old female with fairly severe spinal stenosis from C4-C6, she does have some disease at the adjacent levels as well. She has done all of the appropriate conservative treatment including therapy and 2 cervical epidurals that have only provided a few weeks of relief, albeit good relief. At this point I do think she is a candidate for at least a to possibly a 3 level ACDF. I would like Dr. Lynann Bologna to weigh in here.

## 2020-08-31 DIAGNOSIS — M5412 Radiculopathy, cervical region: Secondary | ICD-10-CM | POA: Diagnosis not present

## 2020-08-31 DIAGNOSIS — R69 Illness, unspecified: Secondary | ICD-10-CM | POA: Diagnosis not present

## 2020-08-31 DIAGNOSIS — Z716 Tobacco abuse counseling: Secondary | ICD-10-CM | POA: Diagnosis not present

## 2020-09-03 ENCOUNTER — Other Ambulatory Visit: Payer: Self-pay | Admitting: Orthopedic Surgery

## 2020-09-12 DIAGNOSIS — R69 Illness, unspecified: Secondary | ICD-10-CM | POA: Diagnosis not present

## 2020-09-13 NOTE — Progress Notes (Signed)
Your procedure is scheduled on Wednesday, September 18, 2020. (8:30 AM -12:02 PM)  Report to [facility] "A" at 5:30 A.M., and check in at the Admitting office.  Call this number if you have problems the morning of surgery:  [phone]  Call [phone] if you have any questions prior to your surgery date Monday-Friday 8am-4pm    Remember:  Do not eat after midnight the night before your surgery  You may drink clear liquids until 4:30 AM the morning of your surgery.   Clear liquids allowed are: Water, Non-Citrus Juices (without pulp), Carbonated Beverages, Clear Tea, Black Coffee Only, and Gatorade   Enhanced Recovery after Surgery for Orthopedics Enhanced Recovery after Surgery is a protocol used to improve the stress on your body and your recovery after surgery.  Patient Instructions  . The night before surgery:  o No food after midnight. ONLY clear liquids after midnight  .  Marland Kitchen The day of surgery (if you do NOT have diabetes):  o Drink ONE (1) Pre-Surgery Clear Ensure by 4:30 AM the morning of surgery   o This drink was given to you during your hospital  pre-op appointment visit. o Nothing else to drink after completing the  Pre-Surgery Clear Ensure.   Take these medicines the morning of surgery with A SIP OF WATER:  amLODipine (NORVASC) gabapentin (NEURONTIN) pravastatin (PRAVACHOL) omeprazole (PRILOSEC)  IF NEEDED: acetaminophen (TYLENOL) HYDROcodone-acetaminophen (NORCO/VICODIN) loratadine-pseudoephedrine (CLARITIN-D 24-HOUR)   As of today, STOP taking any Aspirin (unless otherwise instructed by your surgeon) Aleve, Naproxen, Ibuprofen, Motrin, Advil, Goody's, BC's, all herbal medications, fish oil, and all vitamins.                      Do not wear jewelry, make up, or nail polish            Do not wear lotions, powders, perfumes, or deodorant.            Do not shave 48 hours prior to surgery.            Do not bring valuables to the hospital.             [facility] is not responsible for any belongings or valuables.  Do NOT Smoke (Tobacco/Vaping) or drink Alcohol 24 hours prior to your procedure If you use a CPAP at night, you may bring all equipment for your overnight stay.   Contacts, glasses, dentures or bridgework may not be worn into surgery.      For patients admitted to the hospital, discharge time will be determined by your treatment team.   Patients discharged the day of surgery will not be allowed to drive home, and someone needs to stay with them for 24 hours.    Special instructions:   [place]- Preparing For Surgery  Before surgery, you can play an important role. Because skin is not sterile, your skin needs to be as free of germs as possible. You can reduce the number of germs on your skin by washing with CHG (chlorahexidine gluconate) Soap before surgery.  CHG is an antiseptic cleaner which kills germs and bonds with the skin to continue killing germs even after washing.    Oral Hygiene is also important to reduce your risk of infection.  Remember - BRUSH YOUR TEETH THE MORNING OF SURGERY WITH YOUR REGULAR TOOTHPASTE  Please do not use if you have an allergy to CHG or antibacterial soaps. If your skin becomes reddened/irritated stop using  the CHG.  Do not shave (including legs and underarms) for at least 48 hours prior to first CHG shower. It is OK to shave your face.  Please follow these instructions carefully.   1. Shower the NIGHT BEFORE SURGERY and the MORNING OF SURGERY with CHG Soap.   2. If you chose to wash your hair, wash your hair first as usual with your normal shampoo.  3. After you shampoo, rinse your hair and body thoroughly to remove the shampoo.  4. Use CHG as you would any other liquid soap. You can apply CHG directly to the skin and wash gently with a scrungie or a clean washcloth.   5. Apply the CHG Soap to your body ONLY FROM THE NECK DOWN.  Do not use on open wounds or open sores.  Avoid contact with your eyes, ears, mouth and genitals (private parts). Wash Face and genitals (private parts)  with your normal soap.   6. Wash thoroughly, paying special attention to the area where your surgery will be performed.  7. Thoroughly rinse your body with warm water from the neck down.  8. DO NOT shower/wash with your normal soap after using and rinsing off the CHG Soap.  9. Pat yourself dry with a CLEAN TOWEL.  10. Wear CLEAN PAJAMAS to bed the night before surgery  11. Place CLEAN SHEETS on your bed the night of your first shower and DO NOT SLEEP WITH PETS.   Day of Surgery: Wear Clean/Comfortable clothing the morning of surgery Do not apply any deodorants/lotions.   Remember to brush your teeth WITH YOUR REGULAR TOOTHPASTE.   Please read over the following fact sheets that you were given.

## 2020-09-16 ENCOUNTER — Other Ambulatory Visit (HOSPITAL_COMMUNITY)
Admission: RE | Admit: 2020-09-16 | Discharge: 2020-09-16 | Disposition: A | Discharge status: Home / Self Care | Payer: Medicare HMO | Source: Ambulatory Visit | Attending: Orthopedic Surgery | Admitting: Orthopedic Surgery

## 2020-09-16 ENCOUNTER — Encounter (HOSPITAL_COMMUNITY)
Admission: RE | Admit: 2020-09-16 | Discharge: 2020-09-16 | Disposition: A | Discharge status: Home / Self Care | Payer: Medicare HMO | Source: Ambulatory Visit | Attending: Orthopedic Surgery | Admitting: Orthopedic Surgery

## 2020-09-16 ENCOUNTER — Encounter (HOSPITAL_COMMUNITY): Payer: Self-pay

## 2020-09-16 ENCOUNTER — Other Ambulatory Visit: Payer: Self-pay

## 2020-09-16 DIAGNOSIS — Z20822 Contact with and (suspected) exposure to covid-19: Secondary | ICD-10-CM | POA: Insufficient documentation

## 2020-09-16 DIAGNOSIS — I10 Essential (primary) hypertension: Secondary | ICD-10-CM | POA: Diagnosis not present

## 2020-09-16 DIAGNOSIS — Z01818 Encounter for other preprocedural examination: Secondary | ICD-10-CM | POA: Insufficient documentation

## 2020-09-16 DIAGNOSIS — M5412 Radiculopathy, cervical region: Secondary | ICD-10-CM | POA: Diagnosis not present

## 2020-09-16 DIAGNOSIS — Z01812 Encounter for preprocedural laboratory examination: Secondary | ICD-10-CM | POA: Insufficient documentation

## 2020-09-16 HISTORY — DX: Unspecified osteoarthritis, unspecified site: M19.90

## 2020-09-16 LAB — URINALYSIS, ROUTINE W REFLEX MICROSCOPIC
Bacteria, UA: NONE SEEN
Bilirubin Urine: NEGATIVE
Glucose, UA: NEGATIVE mg/dL
Hgb urine dipstick: NEGATIVE
Ketones, ur: NEGATIVE mg/dL
Nitrite: NEGATIVE
Protein, ur: NEGATIVE mg/dL
Specific Gravity, Urine: 1.003 — ABNORMAL LOW (ref 1.005–1.030)
pH: 6 (ref 5.0–8.0)

## 2020-09-16 LAB — CBC WITH DIFFERENTIAL/PLATELET
Abs Immature Granulocytes: 0.04 10*3/uL (ref 0.00–0.07)
Basophils Absolute: 0 10*3/uL (ref 0.0–0.1)
Basophils Relative: 0 %
Eosinophils Absolute: 0.1 10*3/uL (ref 0.0–0.5)
Eosinophils Relative: 1 %
HCT: 49.2 % — ABNORMAL HIGH (ref 36.0–46.0)
Hemoglobin: 16.3 g/dL — ABNORMAL HIGH (ref 12.0–15.0)
Immature Granulocytes: 0 %
Lymphocytes Relative: 22 %
Lymphs Abs: 2 10*3/uL (ref 0.7–4.0)
MCH: 31.2 pg (ref 26.0–34.0)
MCHC: 33.1 g/dL (ref 30.0–36.0)
MCV: 94.1 fL (ref 80.0–100.0)
Monocytes Absolute: 0.5 10*3/uL (ref 0.1–1.0)
Monocytes Relative: 6 %
Neutro Abs: 6.4 10*3/uL (ref 1.7–7.7)
Neutrophils Relative %: 71 %
Platelets: 317 10*3/uL (ref 150–400)
RBC: 5.23 MIL/uL — ABNORMAL HIGH (ref 3.87–5.11)
RDW: 12.1 % (ref 11.5–15.5)
WBC: 9.2 10*3/uL (ref 4.0–10.5)
nRBC: 0 % (ref 0.0–0.2)

## 2020-09-16 LAB — COMPREHENSIVE METABOLIC PANEL
ALT: 24 U/L (ref 0–44)
AST: 25 U/L (ref 15–41)
Albumin: 4 g/dL (ref 3.5–5.0)
Alkaline Phosphatase: 81 U/L (ref 38–126)
Anion gap: 11 (ref 5–15)
BUN: 11 mg/dL (ref 8–23)
CO2: 27 mmol/L (ref 22–32)
Calcium: 10.2 mg/dL (ref 8.9–10.3)
Chloride: 102 mmol/L (ref 98–111)
Creatinine, Ser: 0.68 mg/dL (ref 0.44–1.00)
GFR, Estimated: >60 mL/min (ref >60)
Glucose, Bld: 107 mg/dL — ABNORMAL HIGH (ref 70–99)
Potassium: 4.2 mmol/L (ref 3.5–5.1)
Sodium: 140 mmol/L (ref 135–145)
Total Bilirubin: 0.7 mg/dL (ref 0.3–1.2)
Total Protein: 7.2 g/dL (ref 6.5–8.1)

## 2020-09-16 LAB — SURGICAL PCR SCREEN
MRSA, PCR: NEGATIVE
Staphylococcus aureus: NEGATIVE

## 2020-09-16 LAB — APTT: aPTT: 33 seconds (ref 24–36)

## 2020-09-16 LAB — PROTIME-INR
INR: 1 (ref 0.8–1.2)
Prothrombin Time: 12.3 seconds (ref 11.4–15.2)

## 2020-09-16 LAB — TYPE AND SCREEN
ABO/RH(D): A POS
Antibody Screen: NEGATIVE

## 2020-09-16 NOTE — Progress Notes (Addendum)
PCP - Mayra Neer, MD Cardiologist - Denies   PPM/ICD - Denies  Chest x-ray - N/A EKG - 09/16/20 Stress Test - Denies ECHO - Denies Cardiac Cath - Denies  Sleep Study - Denies   Patient denies being diabetic.  Blood Thinner Instructions: N/A Aspirin Instructions: N/A  ERAS Protcol - Yes PRE-SURGERY Ensure or G2- Ensure given.  COVID TEST- 09/16/20   Anesthesia review: Yes; abnormal EKG.  Patient denies shortness of breath, fever, cough and chest pain at PAT appointment.   All instructions explained to the patient, with a verbal understanding of the material. Patient agrees to go over the instructions while at home for a better understanding. Patient also instructed to self quarantine after being tested for COVID-19. The opportunity to ask questions was provided.

## 2020-09-16 NOTE — Progress Notes (Signed)
Abnormal UA called in to [facility] w/ Dr. Laurena Bering office.

## 2020-09-16 NOTE — Progress Notes (Signed)
Your procedure is scheduled on Wednesday, September 18, 2020. (8:30 AM -12:02 PM)  Report to [facility] "A" at 6:30 A.M., and check in at the Admitting office.  Call this number if you have problems the morning of surgery:  [phone]  Call [phone] if you have any questions prior to your surgery date Monday-Friday 8am-4pm    Remember:  Do not eat after midnight the night before your surgery.  You may drink clear liquids until 5:30 AM the morning of your surgery.   Clear liquids allowed are: Water, Non-Citrus Juices (without pulp), Carbonated Beverages, Clear Tea, Black Coffee Only, and Gatorade.   Enhanced Recovery after Surgery for Orthopedics Enhanced Recovery after Surgery is a protocol used to improve the stress on your body and your recovery after surgery.  Patient Instructions  . The night before surgery:  o No food after midnight. ONLY clear liquids after midnight  .  Marland Kitchen The day of surgery (if you do NOT have diabetes):  o Drink ONE (1) Pre-Surgery Clear Ensure by 5:30 AM the morning of surgery   o This drink was given to you during your hospital pre-op appointment visit. o Nothing else to drink after completing the  Pre-Surgery Clear Ensure.   Take these medicines the morning of surgery with A SIP OF WATER:  amLODipine (NORVASC) gabapentin (NEURONTIN) pravastatin (PRAVACHOL) omeprazole (PRILOSEC)  IF NEEDED: acetaminophen (TYLENOL) HYDROcodone-acetaminophen (NORCO/VICODIN) loratadine-pseudoephedrine (CLARITIN-D 24-HOUR)   As of today, STOP taking any Aspirin (unless otherwise instructed by your surgeon) Aleve, Naproxen, Ibuprofen, Motrin, Advil, Goody's, BC's, all herbal medications, fish oil, and all vitamins.          The Morning of Surgery:            Do not wear jewelry, make up, or nail polish.            Do not wear lotions, powders, perfumes, or deodorant.            Do not shave 48 hours prior to surgery.            Do not bring  valuables to the hospital.            [facility] is not responsible for any belongings or valuables.  Do NOT Smoke (Tobacco/Vaping) or drink Alcohol 24 hours prior to your procedure. If you use a CPAP at night, you may bring all equipment for your overnight stay.   Contacts, glasses, dentures or bridgework may not be worn into surgery.      For patients admitted to the hospital, discharge time will be determined by your treatment team.   Patients discharged the day of surgery will not be allowed to drive home, and someone needs to stay with them for 24 hours.    Special instructions:   [place]- Preparing For Surgery  Before surgery, you can play an important role. Because skin is not sterile, your skin needs to be as free of germs as possible. You can reduce the number of germs on your skin by washing with CHG (chlorahexidine gluconate) Soap before surgery.  CHG is an antiseptic cleaner which kills germs and bonds with the skin to continue killing germs even after washing.    Oral Hygiene is also important to reduce your risk of infection.  Remember - BRUSH YOUR TEETH THE MORNING OF SURGERY WITH YOUR REGULAR TOOTHPASTE  Please do not use if you have an allergy to CHG or antibacterial soaps. If your skin becomes reddened/irritated  stop using the CHG.  Do not shave (including legs and underarms) for at least 48 hours prior to first CHG shower. It is OK to shave your face.  Please follow these instructions carefully.   1. Shower the NIGHT BEFORE SURGERY and the MORNING OF SURGERY with CHG Soap.   2. If you chose to wash your hair, wash your hair first as usual with your normal shampoo.  3. After you shampoo, rinse your hair and body thoroughly to remove the shampoo.  4. Use CHG as you would any other liquid soap. You can apply CHG directly to the skin and wash gently with a scrungie or a clean washcloth.   5. Apply the CHG Soap to your body ONLY FROM THE NECK DOWN.  Do not use on  open wounds or open sores. Avoid contact with your eyes, ears, mouth and genitals (private parts). Wash Face and genitals (private parts)  with your normal soap.   6. Wash thoroughly, paying special attention to the area where your surgery will be performed.  7. Thoroughly rinse your body with warm water from the neck down.  8. DO NOT shower/wash with your normal soap after using and rinsing off the CHG Soap.  9. Pat yourself dry with a CLEAN TOWEL.  10. Wear CLEAN PAJAMAS to bed the night before surgery  11. Place CLEAN SHEETS on your bed the night of your first shower and DO NOT SLEEP WITH PETS.   Day of Surgery: SHOWER Wear Clean/Comfortable clothing the morning of surgery Do not apply any deodorants/lotions.   Remember to brush your teeth WITH YOUR REGULAR TOOTHPASTE.   Please read over the following fact sheets that you were given.

## 2020-09-17 LAB — SARS CORONAVIRUS 2 (TAT 6-24 HRS): SARS Coronavirus 2: NEGATIVE

## 2020-09-17 NOTE — Anesthesia Preprocedure Evaluation (Addendum)
Anesthesia Evaluation  Patient identified by MRN, date of birth, ID band Patient awake    Reviewed: Allergy & Precautions, NPO status , Patient's Chart, lab work & pertinent test results  History of Anesthesia Complications (+) PONV  Airway Mallampati: II  TM Distance: >3 FB Neck ROM: Full    Dental  (+) Dental Advisory Given   Pulmonary COPD, Current Smoker and Patient abstained from smoking., PE (remote) 09/16/2020 SARS coronavirus NEG   breath sounds clear to auscultation       Cardiovascular hypertension, Pt. on medications (-) angina Rhythm:Regular Rate:Normal     Neuro/Psych Anxiety Depression negative neurological ROS     GI/Hepatic Neg liver ROS, GERD  Medicated and Controlled,  Endo/Other  obese  Renal/GU negative Renal ROS     Musculoskeletal   Abdominal (+) + obese,   Peds  Hematology  (+) Blood dyscrasia (Factor V deficiency), ,   Anesthesia Other Findings   Reproductive/Obstetrics                          Anesthesia Physical Anesthesia Plan  ASA: III  Anesthesia Plan: General   Post-op Pain Management:    Induction: Intravenous  PONV Risk Score and Plan: 4 or greater and Ondansetron, Dexamethasone and Scopolamine patch - Pre-op  Airway Management Planned: Oral ETT and Video Laryngoscope Planned  Additional Equipment:   Intra-op Plan:   Post-operative Plan: Extubation in OR  Informed Consent: I have reviewed the patients History and Physical, chart, labs and discussed the procedure including the risks, benefits and alternatives for the proposed anesthesia with the patient or authorized representative who has indicated his/her understanding and acceptance.     Dental advisory given  Plan Discussed with: CRNA and Surgeon  Anesthesia Plan Comments: (PAT note by Karoline Caldwell, PA-C: Patient noted to be mildly tachycardic on arrival to PAT, pulse 109.  Normotensive  with blood pressure 136/78.  Tachycardia on EKG with rate 112, otherwise unremarkable.  Review of records shows the patient has a history of baseline tachycardia.  PCP notes from April 2021 report resting heart rate is 102.  She has no appreciable cardiac history.  She did have a PE at age 45 while on OCPs.   Severe spinal stenosis at C5-6 per MRI 04/14/2020.  Preop labs reviewed, unremarkable.  EKG 09/16/2020: Sinus tachycardia.  Rate 112. Possible Left atrial enlargement. Low voltage QRS. Cannot rule out Anteroseptal infarct , age undetermined.  Does not appear changed from previous. )      Anesthesia Quick Evaluation

## 2020-09-17 NOTE — Progress Notes (Signed)
Anesthesia Chart Review:  Patient noted to be mildly tachycardic on arrival to PAT, pulse 109.  Normotensive with blood pressure 136/78.  Tachycardia on EKG with rate 112, otherwise unremarkable.  Review of records shows the patient has a history of baseline tachycardia.  PCP notes from April 2021 report resting heart rate is 102.  She has no appreciable cardiac history.  She did have a PE at age 69 while on OCPs.   Severe spinal stenosis at C5-6 per MRI 04/14/2020.  Preop labs reviewed, unremarkable.  EKG 09/16/2020: Sinus tachycardia.  Rate 112. Possible Left atrial enlargement. Low voltage QRS. Cannot rule out Anteroseptal infarct , age undetermined.  Does not appear changed from previous.  Wynonia Musty [facility] Short Stay Center/Anesthesiology Phone [phone] 09/17/2020 9:18 AM

## 2020-09-18 ENCOUNTER — Ambulatory Visit (HOSPITAL_COMMUNITY): Payer: Medicare HMO | Admitting: Anesthesiology

## 2020-09-18 ENCOUNTER — Other Ambulatory Visit: Payer: Self-pay

## 2020-09-18 ENCOUNTER — Ambulatory Visit (HOSPITAL_COMMUNITY): Payer: Medicare HMO | Admitting: Physician Assistant

## 2020-09-18 ENCOUNTER — Ambulatory Visit (HOSPITAL_COMMUNITY)
Admission: RE | Disposition: A | Discharge status: Home / Self Care | Payer: Self-pay | Source: Home / Self Care | Attending: Orthopedic Surgery

## 2020-09-18 ENCOUNTER — Observation Stay (HOSPITAL_COMMUNITY)
Admission: RE | Admit: 2020-09-18 | Discharge: 2020-09-19 | Disposition: A | Discharge status: Home / Self Care | Payer: Medicare HMO | Attending: Orthopedic Surgery | Admitting: Orthopedic Surgery

## 2020-09-18 ENCOUNTER — Ambulatory Visit (HOSPITAL_COMMUNITY): Payer: Medicare HMO

## 2020-09-18 ENCOUNTER — Encounter (HOSPITAL_COMMUNITY): Payer: Self-pay | Admitting: Orthopedic Surgery

## 2020-09-18 DIAGNOSIS — G9529 Other cord compression: Secondary | ICD-10-CM | POA: Diagnosis not present

## 2020-09-18 DIAGNOSIS — M4322 Fusion of spine, cervical region: Secondary | ICD-10-CM | POA: Diagnosis not present

## 2020-09-18 DIAGNOSIS — Z85831 Personal history of malignant neoplasm of soft tissue: Secondary | ICD-10-CM | POA: Diagnosis not present

## 2020-09-18 DIAGNOSIS — Z981 Arthrodesis status: Secondary | ICD-10-CM | POA: Diagnosis not present

## 2020-09-18 DIAGNOSIS — M4802 Spinal stenosis, cervical region: Secondary | ICD-10-CM | POA: Diagnosis not present

## 2020-09-18 DIAGNOSIS — I10 Essential (primary) hypertension: Secondary | ICD-10-CM | POA: Diagnosis not present

## 2020-09-18 DIAGNOSIS — F17210 Nicotine dependence, cigarettes, uncomplicated: Secondary | ICD-10-CM | POA: Insufficient documentation

## 2020-09-18 DIAGNOSIS — M5010 Cervical disc disorder with radiculopathy, unspecified cervical region: Secondary | ICD-10-CM | POA: Diagnosis not present

## 2020-09-18 DIAGNOSIS — M5412 Radiculopathy, cervical region: Principal | ICD-10-CM | POA: Insufficient documentation

## 2020-09-18 DIAGNOSIS — Z79899 Other long term (current) drug therapy: Secondary | ICD-10-CM | POA: Diagnosis not present

## 2020-09-18 DIAGNOSIS — R69 Illness, unspecified: Secondary | ICD-10-CM | POA: Diagnosis not present

## 2020-09-18 DIAGNOSIS — Z419 Encounter for procedure for purposes other than remedying health state, unspecified: Secondary | ICD-10-CM

## 2020-09-18 DIAGNOSIS — G959 Disease of spinal cord, unspecified: Secondary | ICD-10-CM | POA: Diagnosis present

## 2020-09-18 DIAGNOSIS — M9981 Other biomechanical lesions of cervical region: Secondary | ICD-10-CM | POA: Diagnosis not present

## 2020-09-18 HISTORY — PX: ANTERIOR CERVICAL DECOMPRESSION/DISCECTOMY FUSION 4 LEVELS: SHX5556

## 2020-09-18 SURGERY — ANTERIOR CERVICAL DECOMPRESSION/DISCECTOMY FUSION 4 LEVELS
Anesthesia: General

## 2020-09-18 MED ORDER — OXYCODONE HCL 5 MG PO TABS: 5.0000 mg | ORAL_TABLET | Freq: Once | ORAL | Status: DC | PRN

## 2020-09-18 MED ORDER — ALUM & MAG HYDROXIDE-SIMETH 200-200-20 MG/5ML PO SUSP: 30.0000 mL | Freq: Four times a day (QID) | ORAL | Status: DC | PRN

## 2020-09-18 MED ORDER — ORAL CARE MOUTH RINSE: 15.0000 mL | Freq: Once | OROMUCOSAL | Status: AC

## 2020-09-18 MED ORDER — AMITRIPTYLINE HCL 25 MG PO TABS
25.0000 mg | ORAL_TABLET | Freq: Every day | ORAL | Status: DC
  Administered 2020-09-18: 25 mg via ORAL
  Filled 2020-09-18: qty 1

## 2020-09-18 MED ORDER — SCOPOLAMINE 1 MG/3DAYS TD PT72: 1.0000 | MEDICATED_PATCH | TRANSDERMAL | Status: DC

## 2020-09-18 MED ORDER — ONDANSETRON HCL 4 MG/2ML IJ SOLN
INTRAMUSCULAR | Status: DC | PRN
  Administered 2020-09-18: 4 mg via INTRAVENOUS

## 2020-09-18 MED ORDER — ACETAMINOPHEN 325 MG PO TABS: 650.0000 mg | ORAL_TABLET | ORAL | Status: DC | PRN

## 2020-09-18 MED ORDER — THROMBIN (RECOMBINANT) 20000 UNITS EX SOLR
CUTANEOUS | Status: AC
  Filled 2020-09-18: qty 20000

## 2020-09-18 MED ORDER — CHOLECALCIFEROL 10 MCG (400 UNIT) PO TABS
400.0000 [IU] | ORAL_TABLET | Freq: Every day | ORAL | Status: DC
  Filled 2020-09-18: qty 1

## 2020-09-18 MED ORDER — ALBUTEROL SULFATE HFA 108 (90 BASE) MCG/ACT IN AERS
INHALATION_SPRAY | RESPIRATORY_TRACT | Status: DC | PRN
  Administered 2020-09-18: 2 via RESPIRATORY_TRACT

## 2020-09-18 MED ORDER — PROPOFOL 10 MG/ML IV BOLUS
INTRAVENOUS | Status: AC
  Filled 2020-09-18: qty 20

## 2020-09-18 MED ORDER — DOCUSATE SODIUM 100 MG PO CAPS
100.0000 mg | ORAL_CAPSULE | Freq: Two times a day (BID) | ORAL | Status: DC
  Administered 2020-09-18: 100 mg via ORAL
  Filled 2020-09-18: qty 1

## 2020-09-18 MED ORDER — OXYCODONE-ACETAMINOPHEN 5-325 MG PO TABS
1.0000 | ORAL_TABLET | ORAL | Status: DC | PRN
  Administered 2020-09-18 – 2020-09-19 (×4): 2 via ORAL
  Filled 2020-09-18 (×4): qty 2

## 2020-09-18 MED ORDER — FLEET ENEMA 7-19 GM/118ML RE ENEM: 1.0000 | ENEMA | Freq: Once | RECTAL | Status: DC | PRN

## 2020-09-18 MED ORDER — VANCOMYCIN HCL 1250 MG/250ML IV SOLN
1250.0000 mg | Freq: Once | INTRAVENOUS | Status: AC
  Administered 2020-09-18: 1250 mg via INTRAVENOUS
  Filled 2020-09-18: qty 250

## 2020-09-18 MED ORDER — METHOCARBAMOL 500 MG PO TABS
500.0000 mg | ORAL_TABLET | Freq: Four times a day (QID) | ORAL | Status: DC | PRN
  Administered 2020-09-18 – 2020-09-19 (×3): 500 mg via ORAL
  Filled 2020-09-18 (×3): qty 1

## 2020-09-18 MED ORDER — METHOCARBAMOL 1000 MG/10ML IJ SOLN
500.0000 mg | Freq: Four times a day (QID) | INTRAVENOUS | Status: DC | PRN
  Filled 2020-09-18: qty 5

## 2020-09-18 MED ORDER — THROMBIN 20000 UNITS EX SOLR
CUTANEOUS | Status: DC | PRN
  Administered 2020-09-18: 20000 [IU] via TOPICAL

## 2020-09-18 MED ORDER — FUROSEMIDE 20 MG PO TABS: 20.0000 mg | ORAL_TABLET | Freq: Every day | ORAL | Status: DC | PRN

## 2020-09-18 MED ORDER — AMLODIPINE BESYLATE 5 MG PO TABS: 5.0000 mg | ORAL_TABLET | Freq: Every day | ORAL | Status: DC

## 2020-09-18 MED ORDER — HYDROMORPHONE HCL 1 MG/ML IJ SOLN
0.2500 mg | INTRAMUSCULAR | Status: DC | PRN
  Administered 2020-09-18: 0.3 mg via INTRAVENOUS

## 2020-09-18 MED ORDER — HYDROMORPHONE HCL 1 MG/ML IJ SOLN
INTRAMUSCULAR | Status: AC
Start: 2020-09-18 — End: 2020-09-19
  Filled 2020-09-18: qty 1

## 2020-09-18 MED ORDER — SODIUM CHLORIDE 0.9 % IV SOLN: 250.0000 mL | INTRAVENOUS | Status: DC

## 2020-09-18 MED ORDER — PHENYLEPHRINE HCL-NACL 10-0.9 MG/250ML-% IV SOLN
INTRAVENOUS | Status: DC | PRN
  Administered 2020-09-18: 25 ug/min via INTRAVENOUS

## 2020-09-18 MED ORDER — PANTOPRAZOLE SODIUM 40 MG PO TBEC: 80.0000 mg | DELAYED_RELEASE_TABLET | Freq: Every day | ORAL | Status: DC

## 2020-09-18 MED ORDER — OXYCODONE HCL 5 MG/5ML PO SOLN: 5.0000 mg | Freq: Once | ORAL | Status: DC | PRN

## 2020-09-18 MED ORDER — ALBUTEROL SULFATE HFA 108 (90 BASE) MCG/ACT IN AERS
INHALATION_SPRAY | RESPIRATORY_TRACT | Status: AC
  Filled 2020-09-18: qty 6.7

## 2020-09-18 MED ORDER — PROMETHAZINE HCL 25 MG/ML IJ SOLN: 6.2500 mg | INTRAMUSCULAR | Status: DC | PRN

## 2020-09-18 MED ORDER — BUPIVACAINE-EPINEPHRINE 0.25% -1:200000 IJ SOLN
INTRAMUSCULAR | Status: DC | PRN
  Administered 2020-09-18: 30 mL

## 2020-09-18 MED ORDER — LIDOCAINE 2% (20 MG/ML) 5 ML SYRINGE
INTRAMUSCULAR | Status: DC | PRN
  Administered 2020-09-18: 60 mg via INTRAVENOUS

## 2020-09-18 MED ORDER — GABAPENTIN 300 MG PO CAPS
600.0000 mg | ORAL_CAPSULE | Freq: Two times a day (BID) | ORAL | Status: DC
  Administered 2020-09-18: 600 mg via ORAL
  Filled 2020-09-18: qty 2

## 2020-09-18 MED ORDER — BISACODYL 5 MG PO TBEC: 5.0000 mg | DELAYED_RELEASE_TABLET | Freq: Every day | ORAL | Status: DC | PRN

## 2020-09-18 MED ORDER — POVIDONE-IODINE 7.5 % EX SOLN
Freq: Once | CUTANEOUS | Status: DC
  Filled 2020-09-18: qty 118

## 2020-09-18 MED ORDER — MAGNESIUM OXIDE 400 (241.3 MG) MG PO TABS: 200.0000 mg | ORAL_TABLET | Freq: Every day | ORAL | Status: DC

## 2020-09-18 MED ORDER — 0.9 % SODIUM CHLORIDE (POUR BTL) OPTIME
TOPICAL | Status: DC | PRN
  Administered 2020-09-18: 1000 mL

## 2020-09-18 MED ORDER — VANCOMYCIN HCL IN DEXTROSE 1-5 GM/200ML-% IV SOLN
INTRAVENOUS | Status: AC
  Administered 2020-09-18: 1000 mg via INTRAVENOUS
  Filled 2020-09-18: qty 200

## 2020-09-18 MED ORDER — PRAVASTATIN SODIUM 10 MG PO TABS: 10.0000 mg | ORAL_TABLET | Freq: Every day | ORAL | Status: DC

## 2020-09-18 MED ORDER — SODIUM CHLORIDE 0.9% FLUSH
3.0000 mL | Freq: Two times a day (BID) | INTRAVENOUS | Status: DC
  Administered 2020-09-18: 3 mL via INTRAVENOUS

## 2020-09-18 MED ORDER — CHLORHEXIDINE GLUCONATE 0.12 % MT SOLN: 15.0000 mL | Freq: Once | OROMUCOSAL | Status: AC

## 2020-09-18 MED ORDER — MENTHOL 3 MG MT LOZG: 1.0000 | LOZENGE | OROMUCOSAL | Status: DC | PRN

## 2020-09-18 MED ORDER — LACTATED RINGERS IV SOLN: INTRAVENOUS | Status: DC

## 2020-09-18 MED ORDER — FENTANYL CITRATE (PF) 100 MCG/2ML IJ SOLN
INTRAMUSCULAR | Status: DC | PRN
  Administered 2020-09-18: 100 ug via INTRAVENOUS
  Administered 2020-09-18: 50 ug via INTRAVENOUS

## 2020-09-18 MED ORDER — MEPERIDINE HCL 25 MG/ML IJ SOLN: 6.2500 mg | INTRAMUSCULAR | Status: DC | PRN

## 2020-09-18 MED ORDER — ONDANSETRON HCL 4 MG/2ML IJ SOLN: 4.0000 mg | Freq: Four times a day (QID) | INTRAMUSCULAR | Status: DC | PRN

## 2020-09-18 MED ORDER — LIDOCAINE 2% (20 MG/ML) 5 ML SYRINGE
INTRAMUSCULAR | Status: AC
  Filled 2020-09-18: qty 5

## 2020-09-18 MED ORDER — SODIUM CHLORIDE 0.9% FLUSH: 3.0000 mL | INTRAVENOUS | Status: DC | PRN

## 2020-09-18 MED ORDER — SCOPOLAMINE 1 MG/3DAYS TD PT72
MEDICATED_PATCH | TRANSDERMAL | Status: AC
  Administered 2020-09-18: 1.5 mg via TRANSDERMAL
  Filled 2020-09-18: qty 1

## 2020-09-18 MED ORDER — ONDANSETRON HCL 4 MG/2ML IJ SOLN
INTRAMUSCULAR | Status: AC
  Filled 2020-09-18: qty 2

## 2020-09-18 MED ORDER — MIDAZOLAM HCL 2 MG/2ML IJ SOLN
INTRAMUSCULAR | Status: AC
  Filled 2020-09-18: qty 2

## 2020-09-18 MED ORDER — SENNOSIDES-DOCUSATE SODIUM 8.6-50 MG PO TABS: 1.0000 | ORAL_TABLET | Freq: Every evening | ORAL | Status: DC | PRN

## 2020-09-18 MED ORDER — PHENOL 1.4 % MT LIQD: 1.0000 | OROMUCOSAL | Status: DC | PRN

## 2020-09-18 MED ORDER — MIDAZOLAM HCL 2 MG/2ML IJ SOLN: 0.5000 mg | Freq: Once | INTRAMUSCULAR | Status: DC | PRN

## 2020-09-18 MED ORDER — ROCURONIUM BROMIDE 10 MG/ML (PF) SYRINGE
PREFILLED_SYRINGE | INTRAVENOUS | Status: DC | PRN
  Administered 2020-09-18: 100 mg via INTRAVENOUS

## 2020-09-18 MED ORDER — PHENYLEPHRINE 40 MCG/ML (10ML) SYRINGE FOR IV PUSH (FOR BLOOD PRESSURE SUPPORT)
PREFILLED_SYRINGE | INTRAVENOUS | Status: DC | PRN
  Administered 2020-09-18 (×2): 80 ug via INTRAVENOUS
  Administered 2020-09-18: 120 ug via INTRAVENOUS

## 2020-09-18 MED ORDER — ROCURONIUM BROMIDE 10 MG/ML (PF) SYRINGE
PREFILLED_SYRINGE | INTRAVENOUS | Status: AC
  Filled 2020-09-18: qty 10

## 2020-09-18 MED ORDER — LORATADINE-PSEUDOEPHEDRINE ER 10-240 MG PO TB24: 1.0000 | ORAL_TABLET | Freq: Every day | ORAL | Status: DC | PRN

## 2020-09-18 MED ORDER — MINERAL OIL LIGHT OIL
TOPICAL_OIL | Freq: Once | Status: DC
  Filled 2020-09-18: qty 10

## 2020-09-18 MED ORDER — ZOLPIDEM TARTRATE 5 MG PO TABS
5.0000 mg | ORAL_TABLET | Freq: Every evening | ORAL | Status: DC | PRN
  Administered 2020-09-18: 5 mg via ORAL
  Filled 2020-09-18: qty 1

## 2020-09-18 MED ORDER — MIDAZOLAM HCL 2 MG/2ML IJ SOLN
INTRAMUSCULAR | Status: DC | PRN
  Administered 2020-09-18: 2 mg via INTRAVENOUS

## 2020-09-18 MED ORDER — ACETAMINOPHEN 650 MG RE SUPP: 650.0000 mg | RECTAL | Status: DC | PRN

## 2020-09-18 MED ORDER — ADULT MULTIVITAMIN W/MINERALS CH: 1.0000 | ORAL_TABLET | Freq: Every day | ORAL | Status: DC

## 2020-09-18 MED ORDER — CHLORHEXIDINE GLUCONATE 0.12 % MT SOLN
OROMUCOSAL | Status: AC
  Administered 2020-09-18: 15 mL via OROMUCOSAL
  Filled 2020-09-18: qty 15

## 2020-09-18 MED ORDER — SUGAMMADEX SODIUM 200 MG/2ML IV SOLN
INTRAVENOUS | Status: DC | PRN
  Administered 2020-09-18: 200 mg via INTRAVENOUS

## 2020-09-18 MED ORDER — PANTOPRAZOLE SODIUM 40 MG IV SOLR: 40.0000 mg | Freq: Every day | INTRAVENOUS | Status: DC

## 2020-09-18 MED ORDER — ONDANSETRON HCL 4 MG PO TABS: 4.0000 mg | ORAL_TABLET | Freq: Four times a day (QID) | ORAL | Status: DC | PRN

## 2020-09-18 MED ORDER — ACETAMINOPHEN 500 MG PO TABS: 1000.0000 mg | ORAL_TABLET | Freq: Once | ORAL | Status: AC

## 2020-09-18 MED ORDER — PROPOFOL 10 MG/ML IV BOLUS
INTRAVENOUS | Status: DC | PRN
  Administered 2020-09-18: 120 mg via INTRAVENOUS

## 2020-09-18 MED ORDER — PHENYLEPHRINE 40 MCG/ML (10ML) SYRINGE FOR IV PUSH (FOR BLOOD PRESSURE SUPPORT)
PREFILLED_SYRINGE | INTRAVENOUS | Status: AC
  Filled 2020-09-18: qty 10

## 2020-09-18 MED ORDER — FENTANYL CITRATE (PF) 250 MCG/5ML IJ SOLN
INTRAMUSCULAR | Status: AC
  Filled 2020-09-18: qty 5

## 2020-09-18 MED ORDER — VANCOMYCIN HCL IN DEXTROSE 1-5 GM/200ML-% IV SOLN: 1000.0000 mg | INTRAVENOUS | Status: AC

## 2020-09-18 MED ORDER — DEXAMETHASONE SODIUM PHOSPHATE 10 MG/ML IJ SOLN
INTRAMUSCULAR | Status: AC
  Filled 2020-09-18: qty 1

## 2020-09-18 MED ORDER — BUPIVACAINE-EPINEPHRINE (PF) 0.25% -1:200000 IJ SOLN
INTRAMUSCULAR | Status: AC
  Filled 2020-09-18: qty 30

## 2020-09-18 MED ORDER — ACETAMINOPHEN 500 MG PO TABS
ORAL_TABLET | ORAL | Status: AC
  Administered 2020-09-18: 1000 mg via ORAL
  Filled 2020-09-18: qty 2

## 2020-09-18 MED ORDER — DEXAMETHASONE SODIUM PHOSPHATE 10 MG/ML IJ SOLN
INTRAMUSCULAR | Status: DC | PRN
  Administered 2020-09-18: 10 mg via INTRAVENOUS

## 2020-09-18 SURGICAL SUPPLY — 74 items
BENZOIN TINCTURE PRP APPL 2/3 (GAUZE/BANDAGES/DRESSINGS) ×2 IMPLANT
BIT DRILL NEURO 2X3.1 SFT TUCH (MISCELLANEOUS) ×1 IMPLANT
BIT DRILL SRG 14X2.2XFLT CHK (BIT) ×1 IMPLANT
BIT DRL SRG 14X2.2XFLT CHK (BIT) ×1
BLADE CLIPPER SURG (BLADE) ×2 IMPLANT
BLADE SURG 15 STRL LF DISP TIS (BLADE) ×1 IMPLANT
BLADE SURG 15 STRL SS (BLADE) ×2
BUR MATCHSTICK NEURO 3.0 LAGG (BURR) ×2 IMPLANT
CARTRIDGE OIL MAESTRO DRILL (MISCELLANEOUS) ×1 IMPLANT
CLOSURE STERI-STRIP 1/4X4 (GAUZE/BANDAGES/DRESSINGS) ×2 IMPLANT
COVER MAYO STAND STRL (DRAPES) ×4 IMPLANT
COVER SURGICAL LIGHT HANDLE (MISCELLANEOUS) ×2 IMPLANT
COVER WAND RF STERILE (DRAPES) ×2 IMPLANT
DECANTER SPIKE VIAL GLASS SM (MISCELLANEOUS) ×2 IMPLANT
DIFFUSER DRILL AIR PNEUMATIC (MISCELLANEOUS) ×2 IMPLANT
DRAPE C-ARM 42X72 X-RAY (DRAPES) ×2 IMPLANT
DRAPE INCISE IOBAN 66X45 STRL (DRAPES) ×4 IMPLANT
DRAPE POUCH INSTRU U-SHP 10X18 (DRAPES) ×2 IMPLANT
DRAPE SURG 17X23 STRL (DRAPES) ×6 IMPLANT
DRILL BIT SKYLINE 14MM (BIT) ×2
DRILL NEURO 2X3.1 SOFT TOUCH (MISCELLANEOUS) ×2
DURAPREP 26ML APPLICATOR (WOUND CARE) ×2 IMPLANT
ELECT COATED BLADE 2.86 ST (ELECTRODE) ×2 IMPLANT
ELECT REM PT RETURN 9FT ADLT (ELECTROSURGICAL) ×2
ELECTRODE REM PT RTRN 9FT ADLT (ELECTROSURGICAL) ×1 IMPLANT
GAUZE 4X4 16PLY RFD (DISPOSABLE) ×2 IMPLANT
GAUZE SPONGE 4X4 12PLY STRL (GAUZE/BANDAGES/DRESSINGS) ×2 IMPLANT
GLOVE BIO SURGEON STRL SZ7 (GLOVE) ×2 IMPLANT
GLOVE BIO SURGEON STRL SZ8 (GLOVE) ×2 IMPLANT
GLOVE BIOGEL PI IND STRL 7.0 (GLOVE) ×2 IMPLANT
GLOVE BIOGEL PI IND STRL 8 (GLOVE) ×1 IMPLANT
GLOVE BIOGEL PI INDICATOR 7.0 (GLOVE) ×2
GLOVE BIOGEL PI INDICATOR 8 (GLOVE) ×1
GOWN STRL REUS W/ TWL LRG LVL3 (GOWN DISPOSABLE) ×1 IMPLANT
GOWN STRL REUS W/ TWL XL LVL3 (GOWN DISPOSABLE) ×1 IMPLANT
GOWN STRL REUS W/TWL LRG LVL3 (GOWN DISPOSABLE) ×2
GOWN STRL REUS W/TWL XL LVL3 (GOWN DISPOSABLE) ×2
INTERLOCK LRDTC CRVCL VBR 7MM (Bone Implant) ×2 IMPLANT
INTERLOCK LRDTC CRVCL VBR SM (Bone Implant) ×1 IMPLANT
IV CATH 14GX2 1/4 (CATHETERS) ×2 IMPLANT
KIT BASIN OR (CUSTOM PROCEDURE TRAY) ×2 IMPLANT
KIT TURNOVER KIT B (KITS) ×2 IMPLANT
LORDOTIC CERVICAL VBR 7MM SM (Bone Implant) ×4 IMPLANT
LORDOTIC CERVICAL VBR SM 5MM (Bone Implant) ×2 IMPLANT
MANIFOLD NEPTUNE II (INSTRUMENTS) ×2 IMPLANT
NEEDLE PRECISIONGLIDE 27X1.5 (NEEDLE) ×2 IMPLANT
NEEDLE SPNL 20GX3.5 QUINCKE YW (NEEDLE) ×2 IMPLANT
NS IRRIG 1000ML POUR BTL (IV SOLUTION) ×2 IMPLANT
OIL CARTRIDGE MAESTRO DRILL (MISCELLANEOUS) ×2
PACK ORTHO CERVICAL (CUSTOM PROCEDURE TRAY) ×2 IMPLANT
PAD ARMBOARD 7.5X6 YLW CONV (MISCELLANEOUS) ×4 IMPLANT
PATTIES SURGICAL .5 X.5 (GAUZE/BANDAGES/DRESSINGS) ×2 IMPLANT
PIN DISTRACTION 14 (PIN) ×4 IMPLANT
PLATE SKYLINE 3 LVL 48MM (Plate) ×2 IMPLANT
POSITIONER HEAD DONUT 9IN (MISCELLANEOUS) ×2 IMPLANT
PUTTY BONE DBX 2.5 MIS (Bone Implant) ×4 IMPLANT
SCREW SKYLINE VAR OS 14MM (Screw) ×16 IMPLANT
SPONGE INTESTINAL PEANUT (DISPOSABLE) ×4 IMPLANT
SPONGE SURGIFOAM ABS GEL 100 (HEMOSTASIS) ×2 IMPLANT
STRIP CLOSURE SKIN 1/2X4 (GAUZE/BANDAGES/DRESSINGS) ×2 IMPLANT
SURGIFLO W/THROMBIN 8M KIT (HEMOSTASIS) ×2 IMPLANT
SUT BONE WAX W31G (SUTURE) ×4 IMPLANT
SUT MNCRL AB 4-0 PS2 18 (SUTURE) ×2 IMPLANT
SUT VIC AB 2-0 CT2 18 VCP726D (SUTURE) ×2 IMPLANT
SYR BULB IRRIG 60ML STRL (SYRINGE) ×2 IMPLANT
SYR CONTROL 10ML LL (SYRINGE) ×4 IMPLANT
TAPE CLOTH 4X10 WHT NS (GAUZE/BANDAGES/DRESSINGS) ×2 IMPLANT
TAPE CLOTH SURG 6X10 WHT LF (GAUZE/BANDAGES/DRESSINGS) ×2 IMPLANT
TAPE UMBILICAL COTTON 1/8X30 (MISCELLANEOUS) ×2 IMPLANT
TOWEL GREEN STERILE (TOWEL DISPOSABLE) ×2 IMPLANT
TOWEL GREEN STERILE FF (TOWEL DISPOSABLE) ×2 IMPLANT
TRAY FOLEY MTR SLVR 16FR STAT (SET/KITS/TRAYS/PACK) ×2 IMPLANT
WATER STERILE IRR 1000ML POUR (IV SOLUTION) ×2 IMPLANT
YANKAUER SUCT BULB TIP NO VENT (SUCTIONS) ×2 IMPLANT

## 2020-09-18 NOTE — Transfer of Care (Signed)
Immediate Anesthesia Transfer of Care Note  Patient: Brittany Jordan  Procedure(s) Performed: ANTERIOR CERVICAL DECOMPRESSION FUSION, CERVICAL 3-4, CERVICAL 4-5, CERVICAL 5-6 WITH INSTRUMENTATION AND ALLOGRAFT (N/A )  Patient Location: PACU  Anesthesia Type:General  Level of Consciousness: drowsy and patient cooperative  Airway & Oxygen Therapy: Patient Spontanous Breathing and Patient connected to nasal cannula oxygen  Post-op Assessment: Report given to RN and Patient moving all extremities X 4  Post vital signs: Reviewed and stable  Last Vitals:  Vitals Value Taken Time  BP 136/65 09/18/20 1135  Temp    Pulse 115 09/18/20 1136  Resp 17 09/18/20 1136  SpO2 92 % 09/18/20 1136  Vitals shown include unvalidated device data.  Last Pain:  Vitals:   09/18/20 0657  PainSc: 8          Complications: No complications documented.

## 2020-09-18 NOTE — OR Nursing (Signed)
Pt is awake,alert and oriented.Pt and/or family verbalized understanding of poc and discharge instructions. Reviewed admission and on going care with receiving RN. Pt is in NAD at this time and is ready to be transferred to floor. Will con't to monitor until pt is transferred. Belongings on bed with patient Pt on 02 1-2l.min via [place]

## 2020-09-18 NOTE — Progress Notes (Signed)
Pharmacy Antibiotic Note  Brittany Jordan is a 69 y.o. female admitted on 09/18/2020 s/p Anterior cervical decompression and fusion C3/4, C4/5, C5/6.  Pharmacy has been consulted for vancomycin dosing for surgical prophylaxis. No drains noted.  Plan: Vancomycin 1250 mg IV x 1 at [place] will sign off.   Height: 5\' 4"  (162.6 cm) Weight: 93.6 kg (206 lb 5.6 oz) IBW/kg (Calculated) : 54.7  Temp (24hrs), Avg:98.5 F (36.9 C), Min:98.2 F (36.8 C), Max:98.7 F (37.1 C)  Recent Labs  Lab 09/16/20 0836  WBC 9.2  CREATININE 0.68    Estimated Creatinine Clearance: 76.8 mL/min (by C-G formula based on SCr of 0.68 mg/dL).    Allergies  Allergen Reactions  . Penicillins Hives   Brittany Jordan A. Levada Dy, PharmD, BCPS, FNKF Clinical Pharmacist [place] Please utilize Amion for appropriate phone number to reach the unit pharmacist ([place])    09/18/2020 1:47 PM

## 2020-09-18 NOTE — Anesthesia Postprocedure Evaluation (Signed)
Anesthesia Post Note  Patient: CHRISTINIA LAMBETH  Procedure(s) Performed: ANTERIOR CERVICAL DECOMPRESSION FUSION, CERVICAL 3-4, CERVICAL 4-5, CERVICAL 5-6 WITH INSTRUMENTATION AND ALLOGRAFT (N/A )     Patient location during evaluation: PACU Anesthesia Type: General Level of consciousness: awake and alert, patient cooperative and oriented Pain management: pain level controlled (pain improving) Vital Signs Assessment: post-procedure vital signs reviewed and stable Respiratory status: spontaneous breathing, nonlabored ventilation and respiratory function stable Cardiovascular status: blood pressure returned to baseline and stable Postop Assessment: no apparent nausea or vomiting Anesthetic complications: no   No complications documented.  Last Vitals:  Vitals:   09/18/20 1230 09/18/20 1303  BP: 132/79 (!) 158/72  Pulse: (!) 115 (!) 118  Resp: (!) 25 18  Temp: 37.1 C 36.9 C  SpO2: 91% 94%    Last Pain:  Vitals:   09/18/20 1345  TempSrc:   PainSc: 8                  Fatih Stalvey,E. Matia Zelada

## 2020-09-18 NOTE — H&P (Signed)
PREOPERATIVE H&P  Chief Complaint: Left shoulder pain  HPI: Brittany Jordan is a 69 y.o. female who presents with ongoing pain in the left shoulder and left arm  MRI reveals stenosis and cord compression spanning C3-C6.  Patient has failed multiple forms of conservative care, including epidural injections, and continues to have pain (see office notes for additional details regarding the patient's full course of treatment)  Past Medical History:  Diagnosis Date  . Anxiety   . Arthritis   . Cholelithiasis   . Complication of anesthesia   . Depression   . Factor V deficiency ([place])   . Gastroesophageal reflux disease   . Hemoptysis   . Hypertension   . 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 Lupus ([place])    of the skin  . PONV (postoperative nausea and vomiting)   . Pulmonary embolism [facility])    Past Surgical History:  Procedure Laterality Date  . APPENDECTOMY  86  . CHOLECYSTECTOMY  1992  . MELANOMA EXCISION Left 04/13/2019   Procedure: EXCISION LEFT LEG LIPOSARCOMA;  Surgeon: Erroll Luna, MD;  Location: [facility];  Service: General;  Laterality: Left;  . TOTAL ABDOMINAL HYSTERECTOMY Bilateral 07/06/2001   Social History   Socioeconomic History  . Marital status: Married    Spouse name: Timmothy Sours  . Number of children: 0  . Years of education: Not on file  . Highest education level: Not on file  Occupational History  . Occupation: Clinical cytogeneticist  Tobacco Use  . Smoking status: Current Every Day Smoker    Packs/day: 0.50    Years: 43.00    Pack years: 21.50    Types: Cigarettes  . Smokeless tobacco: Never Used  Vaping Use  . Vaping Use: Never used  Substance and Sexual Activity  . Alcohol use: Not Currently    Comment: occasional  . Drug use: Never  . Sexual activity: Not on file  Other Topics Concern  . Not on file  Social History Narrative  . Not on file   Social Determinants of Health   Financial  Resource Strain:   . Difficulty of Paying Living Expenses: Not on file  Food Insecurity:   . Worried About Charity fundraiser in the Last Year: Not on file  . Ran Out of Food in the Last Year: Not on file  Transportation Needs:   . Lack of Transportation (Medical): Not on file  . Lack of Transportation (Non-Medical): Not on file  Physical Activity:   . Days of Exercise per Week: Not on file  . Minutes of Exercise per Session: Not on file  Stress:   . Feeling of Stress : Not on file  Social Connections:   . Frequency of Communication with Friends and Family: Not on file  . Frequency of Social Gatherings with Friends and Family: Not on file  . Attends Religious Services: Not on file  . Active Member of Clubs or Organizations: Not on file  . Attends Archivist Meetings: Not on file  . Marital Status: Not on file   Family History  Problem Relation Age of Onset  . Stroke Father   . Factor V Leiden deficiency Father   . Arthritis Father   . Heart disease Mother   . Hypertension Mother   . Arthritis Mother   . Stroke Mother   . Melanoma Mother   . Arthritis Sister   . Alcohol abuse Brother  Allergies  Allergen Reactions  . Penicillins Hives   Prior to Admission medications   Medication Sig Start Date End Date Taking? Authorizing Provider  acetaminophen (TYLENOL) 650 MG CR tablet Take 1,300 mg by mouth every 8 (eight) hours as needed for pain.   Yes [provider]  amitriptyline (ELAVIL) 25 MG tablet Take 25 mg by mouth at bedtime.  01/03/19  Yes [provider]  amLODipine (NORVASC) 5 MG tablet Take 5 mg by mouth daily.  02/01/19  Yes [provider]  gabapentin (NEURONTIN) 300 MG capsule One tab PO qHS for a week, then BID for a week, then TID. May double weekly to a max of 3,600mg /day Patient taking differently: Take 600 mg by mouth 2 (two) times daily.  07/03/20  Yes Silverio Decamp, MD  HYDROcodone-acetaminophen (NORCO/VICODIN)  5-325 MG tablet Take 1 tablet by mouth 3 (three) times daily as needed for moderate pain.   Yes [provider]  loratadine-pseudoephedrine (CLARITIN-D 24-HOUR) 10-240 MG 24 hr tablet Take 1 tablet by mouth daily as needed for allergies.   Yes [provider]  Magnesium 250 MG TABS Take 250 mg by mouth daily.   Yes [provider]  Multiple Vitamins-Minerals (CENTRUM SILVER 50+WOMEN) TABS Take 1 tablet by mouth daily.    Yes [provider]  naproxen sodium (ALEVE) 220 MG tablet Take 440 mg by mouth 3 (three) times daily as needed (pain).   Yes [provider]  omeprazole (PRILOSEC) 20 MG capsule Take 20 mg by mouth daily.     Yes [provider]  pravastatin (PRAVACHOL) 10 MG tablet Take 10 mg by mouth daily. 01/03/19  Yes [provider]  Turmeric Curcumin 500 MG CAPS Take 500 mg by mouth daily.   Yes [provider]  Vitamin D, Cholecalciferol, 10 MCG (400 UNIT) TABS Take 400 Units by mouth daily.    Yes [provider]  furosemide (LASIX) 20 MG tablet Take 20 mg by mouth daily as needed for edema.  07/12/20   [provider]  HYDROcodone-acetaminophen (NORCO) 10-325 MG tablet Take 1 tablet by mouth every 8 (eight) hours as needed. Patient not taking: Reported on 09/09/2020 08/12/20   Silverio Decamp, MD     All other systems have been reviewed and were otherwise negative with the exception of those mentioned in the HPI and as above.  Physical Exam: Vitals:   09/18/20 0701  BP: (!) 146/72  Pulse: (!) 114  Resp: 20  Temp: 98.2 F (36.8 C)  SpO2: 94%    Body mass index is 35.42 kg/m.  General: Alert, no acute distress Cardiovascular: No pedal edema Respiratory: No cyanosis, no use of accessory musculature Skin: No lesions in the area of chief complaint Neurologic: Sensation intact distally Psychiatric: Patient is competent for consent with normal mood and affect Lymphatic: No axillary  or cervical lymphadenopathy   Assessment/Plan: SPINAL CORD COMPRESSION AND NEUROFORAMINAL STENOSIS SPANNING CERVICAL 3 - CERVICAL 6 Plan for Procedure(s): ANTERIOR CERVICAL DECOMPRESSION FUSION, CERVICAL 3-4, CERVICAL 4-5, CERVICAL 5-6 WITH INSTRUMENTATION AND ALLOGRAFT   Norva Karvonen, MD 09/18/2020 7:21 AM

## 2020-09-18 NOTE — Anesthesia Procedure Notes (Signed)
Procedure Name: Intubation Date/Time: 09/18/2020 8:43 AM Performed by: Barrington Ellison, CRNA Pre-anesthesia Checklist: Patient identified, Emergency Drugs available, Suction available and Patient being monitored Patient Re-evaluated:Patient Re-evaluated prior to induction Oxygen Delivery Method: Circle System Utilized Preoxygenation: Pre-oxygenation with 100% oxygen Induction Type: IV induction Ventilation: Mask ventilation without difficulty Laryngoscope Size: Glidescope and 3 Grade View: Grade I Tube type: Oral Number of attempts: 1 Airway Equipment and Method: Stylet,  Oral airway and Video-laryngoscopy Placement Confirmation: ETT inserted through vocal cords under direct vision,  positive ETCO2 and breath sounds checked- equal and bilateral Tube secured with: Tape Dental Injury: Teeth and Oropharynx as per pre-operative assessment  Difficulty Due To: Difficulty was anticipated, Difficult Airway-  due to neck instability and Difficult Airway- due to limited oral opening Future Recommendations: Recommend- induction with short-acting agent, and alternative techniques readily available

## 2020-09-18 NOTE — Op Note (Signed)
PATIENT NAME: Brittany Jordan   MEDICAL RECORD NO.:   [id number]    DATE OF BIRTH: [birth year]   DATE OF PROCEDURE: 09/18/2020                               OPERATIVE REPORT     PREOPERATIVE DIAGNOSES: 1. Left-sided cervical radiculopathy. 2. Spinal stenosis spanning C4-C7, including neuroforaminal stenosis and spinal cord compression 3. Morbid obesity, with BMI of 35   POSTOPERATIVE DIAGNOSES: 1. Left-sided cervical radiculopathy. 2. Spinal stenosis spanning C4-C7, including neuroforaminal stenosis and spinal cord compression 3. Morbid obesity, with BMI of 35   PROCEDURE: 1. Anterior cervical decompression and fusion C3/4, C4/5, C5/6 2. Placement of anterior instrumentation, C3-C6. 3. Insertion of interbody device x 3 (Titan intervertebral spacers). 4. Intraoperative use of fluoroscopy. 5. Use of morselized allograft - DBX-mix   SURGEON:  Phylliss Bob, MD   ASSISTANT:  Pricilla Holm, PA-C.   ANESTHESIA:  General endotracheal anesthesia.   COMPLICATIONS:  None.   DISPOSITION:  Stable.   ESTIMATED BLOOD LOSS:  Minimal.   INDICATIONS FOR SURGERY:  Briefly, Brittany Jordan is a pleasant 69 y.o. year- old patient, who did present to me with severe pain in the neck and left shoulder, and left arm.  The patient's MRI did reveal the findings noted above.  Given the patient's ongoing rather debilitating pain and lack of improvement with appropriate treatment measures, we did discuss proceeding with the procedure noted above.  The patient was fully aware of the risks and limitations of surgery as outlined in my preoperative note.   OPERATIVE DETAILS:  On 09/18/2020, the patient was brought to surgery and general endotracheal anesthesia was administered.  The patient was placed supine on the hospital bed. The neck was gently extended.  All bony prominences were meticulously padded.  The neck was prepped and draped in the usual sterile fashion.  At this point, I did make a left-sided  transverse incision.  The platysma was incised.  A Smith-Robinson approach was used and the anterior spine was identified. A self-retaining retractor was placed.  I then subperiosteally exposed the vertebral bodies from C3-C6.  Caspar pins were then placed into the C5 and C6 vertebral bodies and distraction was applied.  A thorough and complete C5-6 intervertebral diskectomy was performed.  The posterior longitudinal ligament was identified and entered using a nerve hook.  I then used #1 followed by #2 Kerrison to perform a thorough and complete intervertebral diskectomy.  The spinal canal was thoroughly decompressed, as was the right and left neuroforamen.  The endplates were then prepared and the appropriate-sized intervertebral spacer was then packed with DBX-mix and tamped into position in the usual fashion.  The lower Caspar pin was then removed and placed into the C4 vertebral body and once again, distraction was applied across the C4-5 intervertebral space.  I then again performed a thorough and complete diskectomy, thoroughly decompressing the spinal canal and bilateral neuroforamena.  After preparing the endplates, the appropriate-sized intervertebral spacer was packed with DBX-mix and tamped into position.  The lower Caspar pin was then removed and placed into the C3 vertebral body and once again, distraction was applied across the C3-4 intervertebral space.  I then again performed a thorough and complete diskectomy, thoroughly decompressing the spinal canal and bilateral neuroforamena.   I did pay particular attention to the left foramen, as this was very stenotic.  I did thoroughly decompress the foramen,  and was very pleased with the decompression. After preparing the endplates, the appropriate-sized intervertebral spacer was packed with DBX-mix and tamped into position.  The Caspar pins then were removed and bone wax was placed in their place.  The appropriate-sized anterior  cervical plate was placed over the anterior spine.  14 mm variable angle screws were placed, 2 in each vertebral body from C3-C6 for a total of 8 vertebral body screws.  The screws were then locked to the plate using the Cam locking mechanism.  I was very pleased with the final fluoroscopic images.  The wound was then irrigated.  The wound was then explored for any undue bleeding and there was no bleeding noted. The wound was then closed in layers using 2-0 Vicryl, followed by 4-0 Monocryl.  Benzoin and Steri-Strips were applied, followed by sterile dressing.  All instrument counts were correct at the termination of the procedure.   Of note, Pricilla Holm, PA-C, was my assistant throughout surgery, and did aid in retraction, suctioning, placement of the hardware, and closure from start to finish.       Phylliss Bob, MD

## 2020-09-19 DIAGNOSIS — M5412 Radiculopathy, cervical region: Secondary | ICD-10-CM | POA: Diagnosis not present

## 2020-09-19 MED ORDER — OXYCODONE-ACETAMINOPHEN 5-325 MG PO TABS
1.0000 | ORAL_TABLET | ORAL | 0 refills | Status: AC | PRN
Start: 2020-09-19 — End: ?

## 2020-09-19 MED ORDER — METHOCARBAMOL 500 MG PO TABS: 500.0000 mg | ORAL_TABLET | Freq: Four times a day (QID) | ORAL | 1 refills | Status: AC | PRN

## 2020-09-19 MED FILL — Thrombin (Recombinant) For Soln 20000 Unit: CUTANEOUS | Qty: 1 | Status: AC

## 2020-09-19 NOTE — Progress Notes (Signed)
Pt doing well. Pt given D/C instructions with verbal understanding. Rx's were sent to the pharmacy by MD. Pt's incision is clean and dry with no sign of infection. Pt's IV was removed prior to D/C. Pt D/C'd home via wheelchair per MD order. Pt is stable @ D/C and has no other needs at this time. Ekaterini Capitano, RN  

## 2020-09-19 NOTE — Progress Notes (Signed)
    Patient doing well  Denies arm pain Tolerating PO well   Physical Exam: Vitals:   09/18/20 2319 09/19/20 0353  BP: (!) 149/67 123/74  Pulse: 98 (!) 107  Resp: 20 20  Temp: 97.6 F (36.4 C) 97.9 F (36.6 C)  SpO2: 93% 92%    Neck soft/supple Dressing in place NVI  POD #1 s/p ACDF, doing well  - encourage ambulation - Percocet for pain, Robaxin for muscle spasms - d/c home today with f/u in 2 weeks

## 2020-09-20 ENCOUNTER — Encounter (HOSPITAL_COMMUNITY): Payer: Self-pay | Admitting: Orthopedic Surgery

## 2020-10-02 DIAGNOSIS — M542 Cervicalgia: Secondary | ICD-10-CM | POA: Diagnosis not present

## 2020-10-02 DIAGNOSIS — Z9889 Other specified postprocedural states: Secondary | ICD-10-CM | POA: Diagnosis not present

## 2020-10-02 NOTE — Discharge Summary (Signed)
Patient ID: Brittany Jordan MRN: [id number] DOB/AGE: 1951/04/25 69 y.o.  Admit date: 09/18/2020 Discharge date: 09/19/2020  Admission Diagnoses:  Active Problems:   Myelopathy [facility])   Discharge Diagnoses:  Same  Past Medical History:  Diagnosis Date  . Anxiety   . Arthritis   . Cholelithiasis   . Complication of anesthesia   . Depression   . Factor V deficiency ([place])   . Gastroesophageal reflux disease   . Hemoptysis   . Hypertension   . 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 Lupus ([place])    of the skin  . PONV (postoperative nausea and vomiting)   . Pulmonary embolism ([place])     Surgeries: Procedure(s): ANTERIOR CERVICAL DECOMPRESSION FUSION, CERVICAL 3-4, CERVICAL 4-5, CERVICAL 5-6 WITH INSTRUMENTATION AND ALLOGRAFT on 09/18/2020   Consultants: None  Discharged Condition: Improved  Hospital Course: Brittany Jordan is an 69 y.o. female who was admitted 09/18/2020 for operative treatment of myeloradiculopathy. Patient has severe unremitting pain that affects sleep, daily activities, and work/hobbies. After pre-op clearance the patient was taken to the operating room on 09/18/2020 and underwent  Procedure(s): ANTERIOR CERVICAL DECOMPRESSION FUSION, CERVICAL 3-4, CERVICAL 4-5, CERVICAL 5-6 WITH INSTRUMENTATION AND ALLOGRAFT.    Patient was given perioperative antibiotics:  Anti-infectives (From admission, onward)   Start     Dose/Rate Route Frequency Ordered Stop   09/18/20 1930  vancomycin (VANCOREADY) IVPB 1250 mg/250 mL        1,250 mg 166.7 mL/hr over 90 Minutes Intravenous  Once 09/18/20 1349 09/18/20 1952   09/18/20 0645  vancomycin (VANCOCIN) IVPB 1000 mg/200 mL premix        1,000 mg 200 mL/hr over 60 Minutes Intravenous On call to O.R. 09/18/20 9702 09/18/20 0836       Patient was given sequential compression devices, early ambulation to prevent DVT.  Patient benefited maximally from hospital stay and there were no  complications.    Recent vital signs: BP 134/74 (BP Location: Right Arm)   Pulse (!) 120   Temp 98.1 F (36.7 C) (Oral)   Resp 14   Ht 5\' 4"  (1.626 m)   Wt 93.6 kg   SpO2 94%   BMI 35.42 kg/m    Discharge Medications:   Allergies as of 09/19/2020      Reactions   Penicillins Hives      Medication List    STOP taking these medications   acetaminophen 650 MG CR tablet Commonly known as: TYLENOL   HYDROcodone-acetaminophen 10-325 MG tablet Commonly known as: NORCO   HYDROcodone-acetaminophen 5-325 MG tablet Commonly known as: NORCO/VICODIN     TAKE these medications   amitriptyline 25 MG tablet Commonly known as: ELAVIL Take 25 mg by mouth at bedtime.   amLODipine 5 MG tablet Commonly known as: NORVASC Take 5 mg by mouth daily.   Centrum Silver 50+Women Tabs Take 1 tablet by mouth daily.   furosemide 20 MG tablet Commonly known as: LASIX Take 20 mg by mouth daily as needed for edema.   gabapentin 300 MG capsule Commonly known as: NEURONTIN One tab PO qHS for a week, then BID for a week, then TID. May double weekly to a max of 3,600mg /day What changed:   how much to take  how to take this  when to take this  additional instructions   loratadine-pseudoephedrine 10-240 MG 24 hr tablet Commonly known as: CLARITIN-D 24-hour Take 1 tablet by mouth daily as needed  for allergies.   Magnesium 250 MG Tabs Take 250 mg by mouth daily.   methocarbamol 500 MG tablet Commonly known as: ROBAXIN Take 1 tablet (500 mg total) by mouth every 6 (six) hours as needed for muscle spasms.   omeprazole 20 MG capsule Commonly known as: PRILOSEC Take 20 mg by mouth daily.   oxyCODONE-acetaminophen 5-325 MG tablet Commonly known as: PERCOCET/ROXICET Take 1-2 tablets by mouth every 4 (four) hours as needed for moderate pain or severe pain.   pravastatin 10 MG tablet Commonly known as: PRAVACHOL Take 10 mg by mouth daily.   Vitamin D (Cholecalciferol) 10 MCG (400  UNIT) Tabs Take 400 Units by mouth daily.       Diagnostic Studies: DG Cervical Spine 1 View  Result Date: 09/18/2020 CLINICAL DATA:  ACDF EXAM: DG CERVICAL SPINE - 1 VIEW; DG C-ARM 1-60 MIN COMPARISON:  04/14/2020 FINDINGS: Initial film shows a needle the anterior disc space of C3-4. Second film shows ACDF from C3 through C6. Interbody spacers. Anterior plate and screws. No unexpected finding. IMPRESSION: ACDF C3 through C6. Electronically Signed   By: Nelson Chimes M.D.   On: 09/18/2020 12:29   DG C-Arm 1-60 Min  Result Date: 09/18/2020 CLINICAL DATA:  ACDF EXAM: DG CERVICAL SPINE - 1 VIEW; DG C-ARM 1-60 MIN COMPARISON:  04/14/2020 FINDINGS: Initial film shows a needle the anterior disc space of C3-4. Second film shows ACDF from C3 through C6. Interbody spacers. Anterior plate and screws. No unexpected finding. IMPRESSION: ACDF C3 through C6. Electronically Signed   By: Nelson Chimes M.D.   On: 09/18/2020 12:29    Disposition: Discharge disposition: 01-Home or Self Care        POD #1 s/p ACDF, doing well  - encourage ambulation - Percocet for pain, Robaxin for muscle spasms -Scripts for pain sent to pharmacy electronically  -D/C instructions sheet printed and in chart -D/C today  -F/U in office 2 weeks   Signed: Lennie Muckle Yotam Rhine 10/02/2020, 2:59 PM

## 2020-10-17 DIAGNOSIS — Z981 Arthrodesis status: Secondary | ICD-10-CM | POA: Diagnosis not present

## 2020-10-29 DIAGNOSIS — Z981 Arthrodesis status: Secondary | ICD-10-CM | POA: Diagnosis not present

## 2020-10-29 DIAGNOSIS — Z4789 Encounter for other orthopedic aftercare: Secondary | ICD-10-CM | POA: Diagnosis not present

## 2020-12-10 DIAGNOSIS — M542 Cervicalgia: Secondary | ICD-10-CM | POA: Diagnosis not present

## 2020-12-10 DIAGNOSIS — Z981 Arthrodesis status: Secondary | ICD-10-CM | POA: Diagnosis not present

## 2020-12-10 DIAGNOSIS — Z4789 Encounter for other orthopedic aftercare: Secondary | ICD-10-CM | POA: Diagnosis not present

## 2020-12-26 DIAGNOSIS — Z0000 Encounter for general adult medical examination without abnormal findings: Secondary | ICD-10-CM | POA: Diagnosis not present

## 2020-12-26 DIAGNOSIS — G2581 Restless legs syndrome: Secondary | ICD-10-CM | POA: Diagnosis not present

## 2020-12-26 DIAGNOSIS — I10 Essential (primary) hypertension: Secondary | ICD-10-CM | POA: Diagnosis not present

## 2020-12-26 DIAGNOSIS — M1990 Unspecified osteoarthritis, unspecified site: Secondary | ICD-10-CM | POA: Diagnosis not present

## 2020-12-26 DIAGNOSIS — J449 Chronic obstructive pulmonary disease, unspecified: Secondary | ICD-10-CM | POA: Diagnosis not present

## 2020-12-26 DIAGNOSIS — R69 Illness, unspecified: Secondary | ICD-10-CM | POA: Diagnosis not present

## 2020-12-26 DIAGNOSIS — G4700 Insomnia, unspecified: Secondary | ICD-10-CM | POA: Diagnosis not present

## 2020-12-26 DIAGNOSIS — K219 Gastro-esophageal reflux disease without esophagitis: Secondary | ICD-10-CM | POA: Diagnosis not present

## 2020-12-26 DIAGNOSIS — C4922 Malignant neoplasm of connective and soft tissue of left lower limb, including hip: Secondary | ICD-10-CM | POA: Diagnosis not present

## 2020-12-26 DIAGNOSIS — E7800 Pure hypercholesterolemia, unspecified: Secondary | ICD-10-CM | POA: Diagnosis not present

## 2020-12-26 DIAGNOSIS — Z713 Dietary counseling and surveillance: Secondary | ICD-10-CM | POA: Diagnosis not present

## 2020-12-26 DIAGNOSIS — Z720 Tobacco use: Secondary | ICD-10-CM | POA: Diagnosis not present

## 2020-12-26 DIAGNOSIS — E669 Obesity, unspecified: Secondary | ICD-10-CM | POA: Diagnosis not present

## 2021-01-13 DIAGNOSIS — R300 Dysuria: Secondary | ICD-10-CM | POA: Diagnosis not present

## 2021-01-13 DIAGNOSIS — N390 Urinary tract infection, site not specified: Secondary | ICD-10-CM | POA: Diagnosis not present

## 2021-02-06 DIAGNOSIS — Z1231 Encounter for screening mammogram for malignant neoplasm of breast: Secondary | ICD-10-CM | POA: Diagnosis not present

## 2021-06-02 DIAGNOSIS — C4922 Malignant neoplasm of connective and soft tissue of left lower limb, including hip: Secondary | ICD-10-CM | POA: Diagnosis not present

## 2021-11-02 IMAGING — XA DG INJECT/[PERSON_NAME] INC NEEDLE/CATH/PLC EPI/CERV/THOR W/IMG
2 series · 2 of 2 positions shown · non-contrast
Comparison: none

CLINICAL DATA: Spondylosis without myelopathy. Left-sided facet
arthropathy. Improvement from the initial epidural injection but
with recurrence of symptoms.

[Series 1: ortho standard · 1 of 1 slices shown (1 of 2)]
[im 1/1]
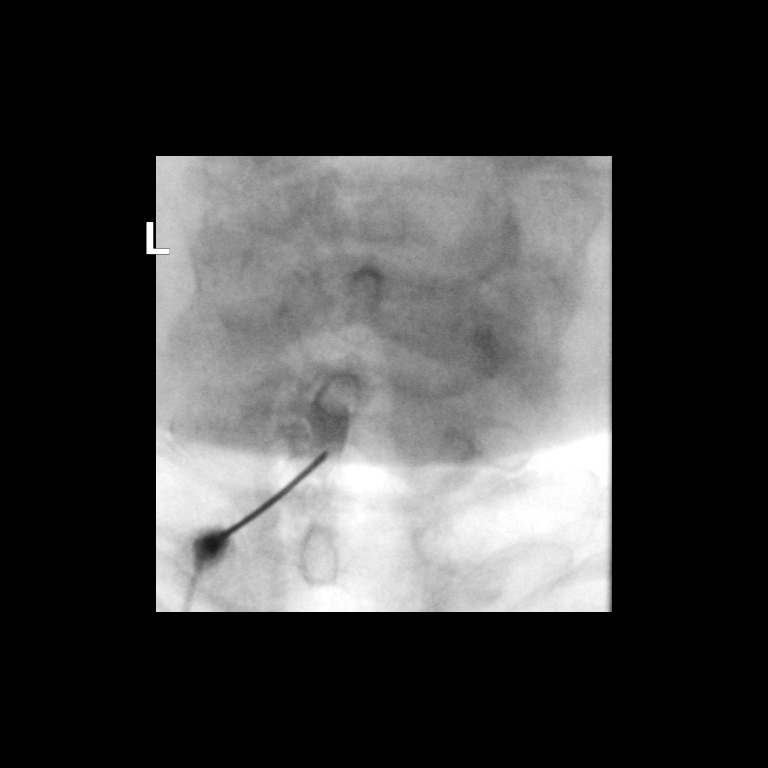

[Series 2: ortho standard · 1 of 1 slices shown (2 of 2)]
[im 1/1]
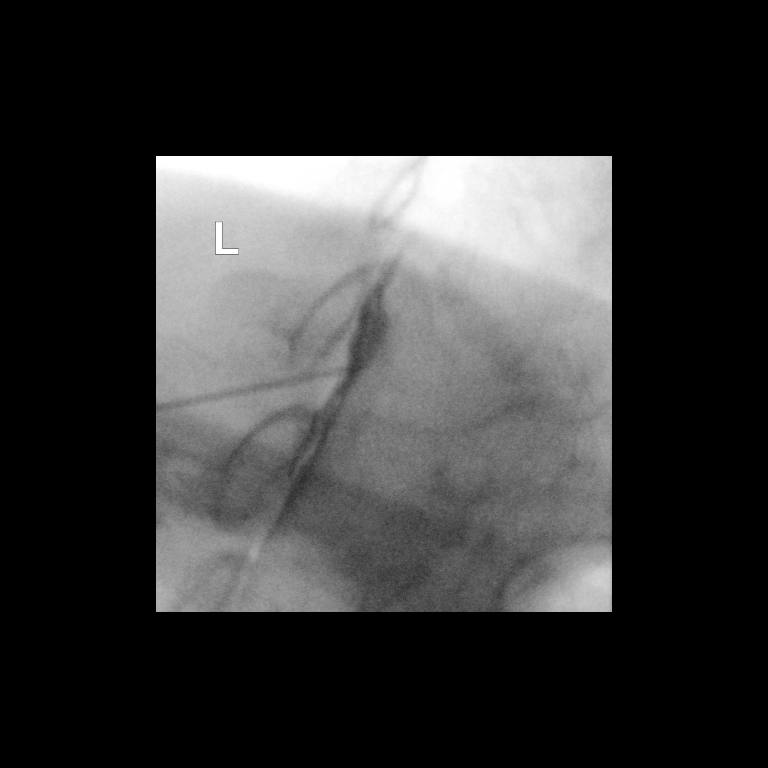

[2 of 2 positions shown; findings below may reference images not displayed]

FLUOROSCOPY TIME:  0 minutes 33 seconds. 17.15 micro gray meter
squared

PROCEDURE:
CERVICAL EPIDURAL INJECTION

An interlaminar approach was performed on the left at C7-T1. A 20
gauge epidural needle was advanced using loss-of-resistance
technique.

DIAGNOSTIC EPIDURAL INJECTION

Injection of Isovue-M 300 shows a good epidural pattern with spread
above and below the level of needle placement, primarily on the
left. No vascular opacification is seen. THERAPEUTIC

EPIDURAL INJECTION

1.5 ml of Kenalog 40 mixed with 1 ml of 1% Lidocaine and 2 ml of
normal saline were then instilled. The procedure was well-tolerated,
and the patient was discharged thirty minutes following the
injection in good condition.
IMPRESSION: Technically successful repeat epidural injection on the left at
C7-T1.

If she does not get better/more long-lasting relief from this
injection, one could consider direct facet injection.

## 2022-01-01 DIAGNOSIS — J449 Chronic obstructive pulmonary disease, unspecified: Secondary | ICD-10-CM | POA: Diagnosis not present

## 2022-01-01 DIAGNOSIS — R519 Headache, unspecified: Secondary | ICD-10-CM | POA: Diagnosis not present

## 2022-01-01 DIAGNOSIS — R059 Cough, unspecified: Secondary | ICD-10-CM | POA: Diagnosis not present

## 2022-01-01 DIAGNOSIS — J3489 Other specified disorders of nose and nasal sinuses: Secondary | ICD-10-CM | POA: Diagnosis not present

## 2022-01-01 DIAGNOSIS — J029 Acute pharyngitis, unspecified: Secondary | ICD-10-CM | POA: Diagnosis not present

## 2022-01-01 DIAGNOSIS — J9801 Acute bronchospasm: Secondary | ICD-10-CM | POA: Diagnosis not present

## 2022-01-01 DIAGNOSIS — R69 Illness, unspecified: Secondary | ICD-10-CM | POA: Diagnosis not present

## 2022-01-01 DIAGNOSIS — U071 COVID-19: Secondary | ICD-10-CM | POA: Diagnosis not present

## 2022-01-08 DIAGNOSIS — R053 Chronic cough: Secondary | ICD-10-CM | POA: Diagnosis not present

## 2022-01-08 DIAGNOSIS — U099 Post covid-19 condition, unspecified: Secondary | ICD-10-CM | POA: Diagnosis not present

## 2022-01-15 DIAGNOSIS — E7800 Pure hypercholesterolemia, unspecified: Secondary | ICD-10-CM | POA: Diagnosis not present

## 2022-01-15 DIAGNOSIS — R7303 Prediabetes: Secondary | ICD-10-CM | POA: Diagnosis not present

## 2022-01-15 DIAGNOSIS — K219 Gastro-esophageal reflux disease without esophagitis: Secondary | ICD-10-CM | POA: Diagnosis not present

## 2022-01-15 DIAGNOSIS — Z720 Tobacco use: Secondary | ICD-10-CM | POA: Diagnosis not present

## 2022-01-15 DIAGNOSIS — J449 Chronic obstructive pulmonary disease, unspecified: Secondary | ICD-10-CM | POA: Diagnosis not present

## 2022-01-15 DIAGNOSIS — R69 Illness, unspecified: Secondary | ICD-10-CM | POA: Diagnosis not present

## 2022-01-15 DIAGNOSIS — I10 Essential (primary) hypertension: Secondary | ICD-10-CM | POA: Diagnosis not present

## 2022-01-15 DIAGNOSIS — Z0000 Encounter for general adult medical examination without abnormal findings: Secondary | ICD-10-CM | POA: Diagnosis not present

## 2022-01-15 DIAGNOSIS — G4700 Insomnia, unspecified: Secondary | ICD-10-CM | POA: Diagnosis not present

## 2022-02-12 DIAGNOSIS — Z1231 Encounter for screening mammogram for malignant neoplasm of breast: Secondary | ICD-10-CM | POA: Diagnosis not present

## 2022-02-17 DIAGNOSIS — R922 Inconclusive mammogram: Secondary | ICD-10-CM | POA: Diagnosis not present

## 2022-02-17 DIAGNOSIS — R928 Other abnormal and inconclusive findings on diagnostic imaging of breast: Secondary | ICD-10-CM | POA: Diagnosis not present

## 2022-02-19 DIAGNOSIS — M546 Pain in thoracic spine: Secondary | ICD-10-CM | POA: Diagnosis not present

## 2022-02-25 DIAGNOSIS — M549 Dorsalgia, unspecified: Secondary | ICD-10-CM | POA: Diagnosis not present

## 2022-02-26 ENCOUNTER — Other Ambulatory Visit: Payer: Self-pay

## 2022-02-26 DIAGNOSIS — N6312 Unspecified lump in the right breast, upper inner quadrant: Secondary | ICD-10-CM | POA: Diagnosis not present

## 2022-02-26 DIAGNOSIS — N6011 Diffuse cystic mastopathy of right breast: Secondary | ICD-10-CM | POA: Diagnosis not present

## 2022-03-23 DIAGNOSIS — Z780 Asymptomatic menopausal state: Secondary | ICD-10-CM | POA: Diagnosis not present

## 2022-03-23 DIAGNOSIS — M85851 Other specified disorders of bone density and structure, right thigh: Secondary | ICD-10-CM | POA: Diagnosis not present

## 2022-03-23 DIAGNOSIS — M85852 Other specified disorders of bone density and structure, left thigh: Secondary | ICD-10-CM | POA: Diagnosis not present

## 2022-04-01 DIAGNOSIS — M545 Low back pain, unspecified: Secondary | ICD-10-CM | POA: Diagnosis not present

## 2022-04-01 DIAGNOSIS — M25552 Pain in left hip: Secondary | ICD-10-CM | POA: Diagnosis not present

## 2022-06-01 DIAGNOSIS — R519 Headache, unspecified: Secondary | ICD-10-CM | POA: Diagnosis not present

## 2022-06-01 DIAGNOSIS — R69 Illness, unspecified: Secondary | ICD-10-CM | POA: Diagnosis not present

## 2022-06-22 DIAGNOSIS — J069 Acute upper respiratory infection, unspecified: Secondary | ICD-10-CM | POA: Diagnosis not present

## 2022-06-22 DIAGNOSIS — J040 Acute laryngitis: Secondary | ICD-10-CM | POA: Diagnosis not present

## 2022-06-22 DIAGNOSIS — R059 Cough, unspecified: Secondary | ICD-10-CM | POA: Diagnosis not present

## 2022-07-22 DIAGNOSIS — I10 Essential (primary) hypertension: Secondary | ICD-10-CM | POA: Diagnosis not present

## 2022-07-22 DIAGNOSIS — R7303 Prediabetes: Secondary | ICD-10-CM | POA: Diagnosis not present

## 2022-07-22 DIAGNOSIS — J449 Chronic obstructive pulmonary disease, unspecified: Secondary | ICD-10-CM | POA: Diagnosis not present

## 2022-07-22 DIAGNOSIS — E7800 Pure hypercholesterolemia, unspecified: Secondary | ICD-10-CM | POA: Diagnosis not present

## 2022-07-22 DIAGNOSIS — R252 Cramp and spasm: Secondary | ICD-10-CM | POA: Diagnosis not present

## 2022-07-22 DIAGNOSIS — R69 Illness, unspecified: Secondary | ICD-10-CM | POA: Diagnosis not present

## 2023-01-20 DIAGNOSIS — Z0000 Encounter for general adult medical examination without abnormal findings: Secondary | ICD-10-CM | POA: Diagnosis not present

## 2023-01-20 DIAGNOSIS — K219 Gastro-esophageal reflux disease without esophagitis: Secondary | ICD-10-CM | POA: Diagnosis not present

## 2023-01-20 DIAGNOSIS — F411 Generalized anxiety disorder: Secondary | ICD-10-CM | POA: Diagnosis not present

## 2023-01-20 DIAGNOSIS — Z9181 History of falling: Secondary | ICD-10-CM | POA: Diagnosis not present

## 2023-01-20 DIAGNOSIS — J440 Chronic obstructive pulmonary disease with acute lower respiratory infection: Secondary | ICD-10-CM | POA: Diagnosis not present

## 2023-01-20 DIAGNOSIS — E7800 Pure hypercholesterolemia, unspecified: Secondary | ICD-10-CM | POA: Diagnosis not present

## 2023-01-20 DIAGNOSIS — G4700 Insomnia, unspecified: Secondary | ICD-10-CM | POA: Diagnosis not present

## 2023-01-20 DIAGNOSIS — M1990 Unspecified osteoarthritis, unspecified site: Secondary | ICD-10-CM | POA: Diagnosis not present

## 2023-01-20 DIAGNOSIS — R7303 Prediabetes: Secondary | ICD-10-CM | POA: Diagnosis not present

## 2023-01-20 DIAGNOSIS — I10 Essential (primary) hypertension: Secondary | ICD-10-CM | POA: Diagnosis not present

## 2023-01-20 DIAGNOSIS — Z720 Tobacco use: Secondary | ICD-10-CM | POA: Diagnosis not present

## 2023-02-18 DIAGNOSIS — Z1231 Encounter for screening mammogram for malignant neoplasm of breast: Secondary | ICD-10-CM | POA: Diagnosis not present

## 2023-03-08 DIAGNOSIS — F17200 Nicotine dependence, unspecified, uncomplicated: Secondary | ICD-10-CM | POA: Diagnosis not present

## 2023-03-08 DIAGNOSIS — S0080XA Unspecified superficial injury of other part of head, initial encounter: Secondary | ICD-10-CM | POA: Diagnosis not present

## 2023-03-08 DIAGNOSIS — Z23 Encounter for immunization: Secondary | ICD-10-CM | POA: Diagnosis not present

## 2023-03-08 DIAGNOSIS — W5503XA Scratched by cat, initial encounter: Secondary | ICD-10-CM | POA: Diagnosis not present

## 2023-03-08 DIAGNOSIS — R300 Dysuria: Secondary | ICD-10-CM | POA: Diagnosis not present

## 2023-04-06 ENCOUNTER — Emergency Department (HOSPITAL_BASED_OUTPATIENT_CLINIC_OR_DEPARTMENT_OTHER): Payer: Medicare HMO

## 2023-04-06 ENCOUNTER — Encounter (HOSPITAL_BASED_OUTPATIENT_CLINIC_OR_DEPARTMENT_OTHER): Payer: Self-pay | Admitting: Emergency Medicine

## 2023-04-06 ENCOUNTER — Emergency Department (HOSPITAL_BASED_OUTPATIENT_CLINIC_OR_DEPARTMENT_OTHER)
Admission: EM | Admit: 2023-04-06 | Discharge: 2023-04-06 | Disposition: A | Discharge status: Home / Self Care | Payer: Medicare HMO | Attending: Emergency Medicine | Admitting: Emergency Medicine

## 2023-04-06 ENCOUNTER — Other Ambulatory Visit: Payer: Self-pay

## 2023-04-06 DIAGNOSIS — Z85831 Personal history of malignant neoplasm of soft tissue: Secondary | ICD-10-CM | POA: Insufficient documentation

## 2023-04-06 DIAGNOSIS — D72829 Elevated white blood cell count, unspecified: Secondary | ICD-10-CM | POA: Diagnosis not present

## 2023-04-06 DIAGNOSIS — R42 Dizziness and giddiness: Secondary | ICD-10-CM | POA: Insufficient documentation

## 2023-04-06 DIAGNOSIS — Z79899 Other long term (current) drug therapy: Secondary | ICD-10-CM | POA: Insufficient documentation

## 2023-04-06 DIAGNOSIS — J209 Acute bronchitis, unspecified: Secondary | ICD-10-CM | POA: Insufficient documentation

## 2023-04-06 LAB — COMPREHENSIVE METABOLIC PANEL
ALT: 23 U/L (ref 0–44)
AST: 25 U/L (ref 15–41)
Albumin: 3.8 g/dL (ref 3.5–5.0)
Alkaline Phosphatase: 83 U/L (ref 38–126)
Anion gap: 11 (ref 5–15)
BUN: 10 mg/dL (ref 8–23)
CO2: 26 mmol/L (ref 22–32)
Calcium: 9.4 mg/dL (ref 8.9–10.3)
Chloride: 101 mmol/L (ref 98–111)
Creatinine, Ser: 0.78 mg/dL (ref 0.44–1.00)
GFR, Estimated: >60 mL/min (ref >60)
Glucose, Bld: 111 mg/dL — ABNORMAL HIGH (ref 70–99)
Potassium: 3.6 mmol/L (ref 3.5–5.1)
Sodium: 138 mmol/L (ref 135–145)
Total Bilirubin: 0.4 mg/dL (ref 0.3–1.2)
Total Protein: 7.3 g/dL (ref 6.5–8.1)

## 2023-04-06 LAB — URINALYSIS, ROUTINE W REFLEX MICROSCOPIC
Bilirubin Urine: NEGATIVE
Glucose, UA: NEGATIVE mg/dL
Hgb urine dipstick: NEGATIVE
Ketones, ur: NEGATIVE mg/dL
Leukocytes,Ua: NEGATIVE
Nitrite: NEGATIVE
Protein, ur: NEGATIVE mg/dL
Specific Gravity, Urine: 1.01 (ref 1.005–1.030)
pH: 7 (ref 5.0–8.0)

## 2023-04-06 LAB — CBC WITH DIFFERENTIAL/PLATELET
Abs Immature Granulocytes: 0.04 10*3/uL (ref 0.00–0.07)
Basophils Absolute: 0 10*3/uL (ref 0.0–0.1)
Basophils Relative: 0 %
Eosinophils Absolute: 0.1 10*3/uL (ref 0.0–0.5)
Eosinophils Relative: 1 %
HCT: 46.6 % — ABNORMAL HIGH (ref 36.0–46.0)
Hemoglobin: 15.5 g/dL — ABNORMAL HIGH (ref 12.0–15.0)
Immature Granulocytes: 0 %
Lymphocytes Relative: 20 %
Lymphs Abs: 2.2 10*3/uL (ref 0.7–4.0)
MCH: 30.5 pg (ref 26.0–34.0)
MCHC: 33.3 g/dL (ref 30.0–36.0)
MCV: 91.6 fL (ref 80.0–100.0)
Monocytes Absolute: 0.8 10*3/uL (ref 0.1–1.0)
Monocytes Relative: 7 %
Neutro Abs: 8 10*3/uL — ABNORMAL HIGH (ref 1.7–7.7)
Neutrophils Relative %: 72 %
Platelets: 308 10*3/uL (ref 150–400)
RBC: 5.09 MIL/uL (ref 3.87–5.11)
RDW: 13.2 % (ref 11.5–15.5)
WBC: 11.1 10*3/uL — ABNORMAL HIGH (ref 4.0–10.5)
nRBC: 0 % (ref 0.0–0.2)

## 2023-04-06 LAB — TROPONIN I (HIGH SENSITIVITY): Troponin I (High Sensitivity): 3 ng/L (ref <18)

## 2023-04-06 MED ORDER — SODIUM CHLORIDE 0.9 % IV BOLUS
500.0000 mL | Freq: Once | INTRAVENOUS | Status: AC
  Administered 2023-04-06: 500 mL via INTRAVENOUS

## 2023-04-06 MED ORDER — AZITHROMYCIN 250 MG PO TABS: 250.0000 mg | ORAL_TABLET | Freq: Every day | ORAL | 0 refills | Status: AC

## 2023-04-06 MED ORDER — ONDANSETRON HCL 4 MG/2ML IJ SOLN
4.0000 mg | Freq: Once | INTRAMUSCULAR | Status: AC
  Administered 2023-04-06: 4 mg via INTRAVENOUS
  Filled 2023-04-06: qty 2

## 2023-04-06 NOTE — ED Provider Notes (Signed)
[facility] Provider Note   CSN: [id number] Arrival date & time: 04/06/23  4098     History {Add pertinent medical, surgical, social history, OB history to HPI:1} Chief Complaint  Patient presents with   Dizziness    Brittany Jordan is a 72 y.o. female.  She has a history of PE not on anticoagulation, liposarcoma.  She is here complaining of not feeling well for a month although cannot really describe how she does not feel well.  She has been getting daily headaches for a long time and has 1 today.  She said she also feels nauseous today which is new for her and had an episode of dizziness when she stood up.  She denies any fevers chills blurry vision double vision numbness weakness chest pain abdominal pain shortness of breath vomiting diarrhea or urinary symptoms.  No medication changes.  No sick contacts or recent travel.  The history is provided by the patient.  Dizziness Quality:  Lightheadedness Severity:  Moderate Onset quality:  Sudden Progression:  Resolved Chronicity:  New Context: standing up   Relieved by:  Lying down Ineffective treatments:  None tried Associated symptoms: headaches and nausea   Associated symptoms: no chest pain, no diarrhea, no hearing loss, no palpitations, no shortness of breath, no syncope, no tinnitus, no vision changes, no vomiting and no weakness   Risk factors: no new medications        Home Medications Prior to Admission medications   Medication Sig Start Date End Date Taking? Authorizing Provider  amitriptyline (ELAVIL) 25 MG tablet Take 25 mg by mouth at bedtime.  01/03/19   [provider]  amLODipine (NORVASC) 5 MG tablet Take 5 mg by mouth daily.  02/01/19   [provider]  furosemide (LASIX) 20 MG tablet Take 20 mg by mouth daily as needed for edema.  07/12/20   [provider]  gabapentin (NEURONTIN) 300 MG capsule One tab PO qHS for a week, then BID for a week,  then TID. May double weekly to a max of 3,600mg /day Patient taking differently: Take 600 mg by mouth 2 (two) times daily.  07/03/20   Monica Becton, MD  loratadine-pseudoephedrine (CLARITIN-D 24-HOUR) 10-240 MG 24 hr tablet Take 1 tablet by mouth daily as needed for allergies.    [provider]  Magnesium 250 MG TABS Take 250 mg by mouth daily.    [provider]  methocarbamol (ROBAXIN) 500 MG tablet Take 1 tablet (500 mg total) by mouth every 6 (six) hours as needed for muscle spasms. 09/19/20   Estill Bamberg, MD  Multiple Vitamins-Minerals (CENTRUM SILVER 50+WOMEN) TABS Take 1 tablet by mouth daily.     [provider]  omeprazole (PRILOSEC) 20 MG capsule Take 20 mg by mouth daily.      [provider]  oxyCODONE-acetaminophen (PERCOCET/ROXICET) 5-325 MG tablet Take 1-2 tablets by mouth every 4 (four) hours as needed for moderate pain or severe pain. 09/19/20   Estill Bamberg, MD  pravastatin (PRAVACHOL) 10 MG tablet Take 10 mg by mouth daily. 01/03/19   [provider]  Vitamin D, Cholecalciferol, 10 MCG (400 UNIT) TABS Take 400 Units by mouth daily.     [provider]      Allergies    Penicillins    Review of Systems   Review of Systems  Constitutional:  Positive for fatigue.  HENT:  Negative for hearing loss and tinnitus.   Eyes:  Negative for visual disturbance.  Respiratory:  Negative for shortness of breath.   Cardiovascular:  Negative for chest pain, palpitations and syncope.  Gastrointestinal:  Positive for nausea. Negative for diarrhea and vomiting.  Genitourinary:  Negative for dysuria.  Neurological:  Positive for dizziness, light-headedness and headaches. Negative for speech difficulty, weakness and numbness.    Physical Exam Updated Vital Signs BP 122/74 (BP Location: Right Arm)   Pulse (!) 110   Temp (!) 97.5 F (36.4 C) (Oral)   Resp 18   SpO2 99%  Physical Exam Vitals and nursing note reviewed.   Constitutional:      General: She is not in acute distress.    Appearance: Normal appearance. She is well-developed.  HENT:     Head: Normocephalic and atraumatic.  Eyes:     Conjunctiva/sclera: Conjunctivae normal.  Cardiovascular:     Rate and Rhythm: Normal rate and regular rhythm.     Heart sounds: No murmur heard. Pulmonary:     Effort: Pulmonary effort is normal. No respiratory distress.     Breath sounds: Normal breath sounds.  Abdominal:     Palpations: Abdomen is soft.     Tenderness: There is no abdominal tenderness. There is no guarding or rebound.  Musculoskeletal:        General: Normal range of motion.     Cervical back: Neck supple.     Right lower leg: No edema.     Left lower leg: No edema.  Skin:    General: Skin is warm and dry.     Capillary Refill: Capillary refill takes less than 2 seconds.  Neurological:     General: No focal deficit present.     Mental Status: She is alert and oriented to person, place, and time.     Cranial Nerves: No cranial nerve deficit.     Sensory: No sensory deficit.     Motor: No weakness.     Gait: Gait normal.     ED Results / Procedures / Treatments   Labs (all labs ordered are listed, but only abnormal results are displayed) Labs Reviewed - No data to display  EKG None  Radiology No results found.  Procedures Procedures  {Document cardiac monitor, telemetry assessment procedure when appropriate:1}  Medications Ordered in ED Medications  sodium chloride 0.9 % bolus 500 mL (has no administration in time range)  ondansetron (ZOFRAN) injection 4 mg (has no administration in time range)    ED Course/ Medical Decision Making/ A&P   {   Click here for ABCD2, HEART and other calculatorsREFRESH Note before signing :1}                          Medical Decision Making Amount and/or Complexity of Data Reviewed Labs: ordered. Radiology: ordered.  Risk Prescription drug management.   This patient complains  of ***; this involves an extensive number of treatment Options and is a complaint that carries with it a high risk of complications and morbidity. The differential includes ***  I ordered, reviewed and interpreted labs, which included *** I ordered medication *** and reviewed PMP when indicated. I ordered imaging studies which included *** and I independently    visualized and interpreted imaging which showed *** Additional history obtained from *** Previous records obtained and reviewed *** I consulted *** and discussed lab and imaging findings and discussed disposition.  Cardiac monitoring reviewed, *** Social determinants considered, *** Critical Interventions: ***  After  the interventions stated above, I reevaluated the patient and found *** Admission and further testing considered, ***   {Document critical care time when appropriate:1} {Document review of labs and clinical decision tools ie heart score, Chads2Vasc2 etc:1}  {Document your independent review of radiology images, and any outside records:1} {Document your discussion with family members, caretakers, and with consultants:1} {Document social determinants of health affecting pt's care:1} {Document your decision making why or why not admission, treatments were needed:1} Final Clinical Impression(s) / ED Diagnoses Final diagnoses:  None    Rx / DC Orders ED Discharge Orders     None

## 2023-04-06 NOTE — ED Notes (Signed)
Attempt at IV access unsuccessful, other nurse to attempt, xray in progress at bedside

## 2023-04-06 NOTE — ED Triage Notes (Signed)
Pt reports dizziness and nausea that started this morning. Pt also reports headache that started this morning and states "I get a headache everyday."

## 2023-04-06 NOTE — ED Notes (Signed)
Patient transported to CT 

## 2023-04-06 NOTE — Discharge Instructions (Signed)
You were seen in the emergency department for feeling dizzy, headache.  You had blood work chest x-ray and CAT scan of your head EKG.  We are treating you for possible bronchitis as your chest x-ray showed may be an early infection.  Please keep well-hydrated.  Contact your primary care doctor for close follow-up.  Return if any worsening or concerning symptoms.

## 2023-04-15 DIAGNOSIS — J441 Chronic obstructive pulmonary disease with (acute) exacerbation: Secondary | ICD-10-CM | POA: Diagnosis not present

## 2023-04-15 DIAGNOSIS — D72829 Elevated white blood cell count, unspecified: Secondary | ICD-10-CM | POA: Diagnosis not present

## 2023-04-15 DIAGNOSIS — Z720 Tobacco use: Secondary | ICD-10-CM | POA: Diagnosis not present

## 2023-04-27 DIAGNOSIS — N958 Other specified menopausal and perimenopausal disorders: Secondary | ICD-10-CM | POA: Diagnosis not present

## 2023-04-27 DIAGNOSIS — N393 Stress incontinence (female) (male): Secondary | ICD-10-CM | POA: Diagnosis not present

## 2023-05-03 DIAGNOSIS — S62363A Nondisplaced fracture of neck of third metacarpal bone, left hand, initial encounter for closed fracture: Secondary | ICD-10-CM | POA: Diagnosis not present

## 2023-05-03 DIAGNOSIS — S60222A Contusion of left hand, initial encounter: Secondary | ICD-10-CM | POA: Diagnosis not present

## 2023-05-04 DIAGNOSIS — S6992XD Unspecified injury of left wrist, hand and finger(s), subsequent encounter: Secondary | ICD-10-CM | POA: Diagnosis not present

## 2023-05-04 DIAGNOSIS — M79642 Pain in left hand: Secondary | ICD-10-CM | POA: Diagnosis not present

## 2023-06-01 DIAGNOSIS — M79642 Pain in left hand: Secondary | ICD-10-CM | POA: Diagnosis not present

## 2023-06-24 DIAGNOSIS — N393 Stress incontinence (female) (male): Secondary | ICD-10-CM | POA: Diagnosis not present

## 2023-06-29 DIAGNOSIS — N393 Stress incontinence (female) (male): Secondary | ICD-10-CM | POA: Diagnosis not present

## 2023-06-29 DIAGNOSIS — N958 Other specified menopausal and perimenopausal disorders: Secondary | ICD-10-CM | POA: Diagnosis not present

## 2023-07-23 DIAGNOSIS — R202 Paresthesia of skin: Secondary | ICD-10-CM | POA: Diagnosis not present

## 2023-07-23 DIAGNOSIS — R7303 Prediabetes: Secondary | ICD-10-CM | POA: Diagnosis not present

## 2023-07-23 DIAGNOSIS — I10 Essential (primary) hypertension: Secondary | ICD-10-CM | POA: Diagnosis not present

## 2023-07-23 DIAGNOSIS — E669 Obesity, unspecified: Secondary | ICD-10-CM | POA: Diagnosis not present

## 2023-07-23 DIAGNOSIS — J449 Chronic obstructive pulmonary disease, unspecified: Secondary | ICD-10-CM | POA: Diagnosis not present

## 2023-07-23 DIAGNOSIS — E7800 Pure hypercholesterolemia, unspecified: Secondary | ICD-10-CM | POA: Diagnosis not present

## 2023-07-23 DIAGNOSIS — M1990 Unspecified osteoarthritis, unspecified site: Secondary | ICD-10-CM | POA: Diagnosis not present

## 2023-07-23 DIAGNOSIS — Z23 Encounter for immunization: Secondary | ICD-10-CM | POA: Diagnosis not present

## 2023-07-23 DIAGNOSIS — Z720 Tobacco use: Secondary | ICD-10-CM | POA: Diagnosis not present

## 2023-10-27 DIAGNOSIS — B353 Tinea pedis: Secondary | ICD-10-CM | POA: Diagnosis not present

## 2023-11-09 DIAGNOSIS — M25511 Pain in right shoulder: Secondary | ICD-10-CM | POA: Diagnosis not present

## 2023-12-09 DIAGNOSIS — M25511 Pain in right shoulder: Secondary | ICD-10-CM | POA: Diagnosis not present

## 2023-12-15 DIAGNOSIS — M129 Arthropathy, unspecified: Secondary | ICD-10-CM | POA: Diagnosis not present

## 2023-12-15 DIAGNOSIS — M7581 Other shoulder lesions, right shoulder: Secondary | ICD-10-CM | POA: Diagnosis not present

## 2023-12-15 DIAGNOSIS — M25511 Pain in right shoulder: Secondary | ICD-10-CM | POA: Diagnosis not present

## 2023-12-15 DIAGNOSIS — M25811 Other specified joint disorders, right shoulder: Secondary | ICD-10-CM | POA: Diagnosis not present

## 2023-12-15 DIAGNOSIS — S46811A Strain of other muscles, fascia and tendons at shoulder and upper arm level, right arm, initial encounter: Secondary | ICD-10-CM | POA: Diagnosis not present

## 2024-01-04 DIAGNOSIS — R051 Acute cough: Secondary | ICD-10-CM | POA: Diagnosis not present

## 2024-01-04 DIAGNOSIS — R52 Pain, unspecified: Secondary | ICD-10-CM | POA: Diagnosis not present

## 2024-01-04 DIAGNOSIS — R6883 Chills (without fever): Secondary | ICD-10-CM | POA: Diagnosis not present

## 2024-01-04 DIAGNOSIS — R5383 Other fatigue: Secondary | ICD-10-CM | POA: Diagnosis not present

## 2024-01-04 DIAGNOSIS — M75111 Incomplete rotator cuff tear or rupture of right shoulder, not specified as traumatic: Secondary | ICD-10-CM | POA: Diagnosis not present

## 2024-02-08 DIAGNOSIS — B353 Tinea pedis: Secondary | ICD-10-CM | POA: Diagnosis not present

## 2024-02-08 DIAGNOSIS — L820 Inflamed seborrheic keratosis: Secondary | ICD-10-CM | POA: Diagnosis not present

## 2024-02-22 DIAGNOSIS — B353 Tinea pedis: Secondary | ICD-10-CM | POA: Diagnosis not present

## 2024-03-14 DIAGNOSIS — B353 Tinea pedis: Secondary | ICD-10-CM | POA: Diagnosis not present

## 2024-03-21 DIAGNOSIS — M75101 Unspecified rotator cuff tear or rupture of right shoulder, not specified as traumatic: Secondary | ICD-10-CM | POA: Diagnosis not present

## 2024-03-21 DIAGNOSIS — D485 Neoplasm of uncertain behavior of skin: Secondary | ICD-10-CM | POA: Diagnosis not present

## 2024-03-21 DIAGNOSIS — B353 Tinea pedis: Secondary | ICD-10-CM | POA: Diagnosis not present

## 2024-03-21 DIAGNOSIS — L209 Atopic dermatitis, unspecified: Secondary | ICD-10-CM | POA: Diagnosis not present

## 2024-03-21 DIAGNOSIS — L308 Other specified dermatitis: Secondary | ICD-10-CM | POA: Diagnosis not present

## 2024-03-23 DIAGNOSIS — Z1231 Encounter for screening mammogram for malignant neoplasm of breast: Secondary | ICD-10-CM | POA: Diagnosis not present

## 2024-03-23 DIAGNOSIS — M8588 Other specified disorders of bone density and structure, other site: Secondary | ICD-10-CM | POA: Diagnosis not present

## 2024-03-24 DIAGNOSIS — M25811 Other specified joint disorders, right shoulder: Secondary | ICD-10-CM | POA: Diagnosis not present

## 2024-03-24 DIAGNOSIS — G8918 Other acute postprocedural pain: Secondary | ICD-10-CM | POA: Diagnosis not present

## 2024-03-24 DIAGNOSIS — M75111 Incomplete rotator cuff tear or rupture of right shoulder, not specified as traumatic: Secondary | ICD-10-CM | POA: Diagnosis not present

## 2024-03-24 DIAGNOSIS — M19011 Primary osteoarthritis, right shoulder: Secondary | ICD-10-CM | POA: Diagnosis not present

## 2024-03-24 DIAGNOSIS — M75101 Unspecified rotator cuff tear or rupture of right shoulder, not specified as traumatic: Secondary | ICD-10-CM | POA: Diagnosis not present

## 2024-03-24 DIAGNOSIS — S46111A Strain of muscle, fascia and tendon of long head of biceps, right arm, initial encounter: Secondary | ICD-10-CM | POA: Diagnosis not present

## 2024-03-24 DIAGNOSIS — S43431A Superior glenoid labrum lesion of right shoulder, initial encounter: Secondary | ICD-10-CM | POA: Diagnosis not present

## 2024-04-04 DIAGNOSIS — N6332 Unspecified lump in axillary tail of the left breast: Secondary | ICD-10-CM | POA: Diagnosis not present

## 2024-04-06 ENCOUNTER — Other Ambulatory Visit: Payer: Self-pay

## 2024-04-06 ENCOUNTER — Other Ambulatory Visit (HOSPITAL_COMMUNITY)
Admission: RE | Admit: 2024-04-06 | Discharge: 2024-04-06 | Disposition: A | Discharge status: Home / Self Care | Source: Ambulatory Visit | Attending: Family Medicine | Admitting: Family Medicine

## 2024-04-06 DIAGNOSIS — R590 Localized enlarged lymph nodes: Secondary | ICD-10-CM | POA: Diagnosis not present

## 2024-04-07 DIAGNOSIS — Z4789 Encounter for other orthopedic aftercare: Secondary | ICD-10-CM | POA: Diagnosis not present

## 2024-04-11 LAB — SURGICAL PATHOLOGY

## 2024-04-25 DIAGNOSIS — L299 Pruritus, unspecified: Secondary | ICD-10-CM | POA: Diagnosis not present

## 2024-04-25 DIAGNOSIS — L209 Atopic dermatitis, unspecified: Secondary | ICD-10-CM | POA: Diagnosis not present

## 2024-05-02 DIAGNOSIS — Z4789 Encounter for other orthopedic aftercare: Secondary | ICD-10-CM | POA: Diagnosis not present

## 2024-05-03 DIAGNOSIS — Z4789 Encounter for other orthopedic aftercare: Secondary | ICD-10-CM | POA: Diagnosis not present

## 2024-05-03 DIAGNOSIS — G8929 Other chronic pain: Secondary | ICD-10-CM | POA: Diagnosis not present

## 2024-05-03 DIAGNOSIS — M25511 Pain in right shoulder: Secondary | ICD-10-CM | POA: Diagnosis not present

## 2024-05-05 DIAGNOSIS — Z4789 Encounter for other orthopedic aftercare: Secondary | ICD-10-CM | POA: Diagnosis not present

## 2024-05-05 DIAGNOSIS — M25511 Pain in right shoulder: Secondary | ICD-10-CM | POA: Diagnosis not present

## 2024-05-05 DIAGNOSIS — G8929 Other chronic pain: Secondary | ICD-10-CM | POA: Diagnosis not present

## 2024-05-10 DIAGNOSIS — G8929 Other chronic pain: Secondary | ICD-10-CM | POA: Diagnosis not present

## 2024-05-10 DIAGNOSIS — Z4789 Encounter for other orthopedic aftercare: Secondary | ICD-10-CM | POA: Diagnosis not present

## 2024-05-10 DIAGNOSIS — M25511 Pain in right shoulder: Secondary | ICD-10-CM | POA: Diagnosis not present

## 2024-05-15 DIAGNOSIS — G8929 Other chronic pain: Secondary | ICD-10-CM | POA: Diagnosis not present

## 2024-05-15 DIAGNOSIS — Z4789 Encounter for other orthopedic aftercare: Secondary | ICD-10-CM | POA: Diagnosis not present

## 2024-05-15 DIAGNOSIS — M25511 Pain in right shoulder: Secondary | ICD-10-CM | POA: Diagnosis not present

## 2024-05-17 DIAGNOSIS — G8929 Other chronic pain: Secondary | ICD-10-CM | POA: Diagnosis not present

## 2024-05-17 DIAGNOSIS — Z4789 Encounter for other orthopedic aftercare: Secondary | ICD-10-CM | POA: Diagnosis not present

## 2024-05-17 DIAGNOSIS — M25511 Pain in right shoulder: Secondary | ICD-10-CM | POA: Diagnosis not present

## 2024-05-23 DIAGNOSIS — L209 Atopic dermatitis, unspecified: Secondary | ICD-10-CM | POA: Diagnosis not present

## 2024-05-23 DIAGNOSIS — L501 Idiopathic urticaria: Secondary | ICD-10-CM | POA: Diagnosis not present

## 2024-05-24 DIAGNOSIS — Z4789 Encounter for other orthopedic aftercare: Secondary | ICD-10-CM | POA: Diagnosis not present

## 2024-05-24 DIAGNOSIS — M25511 Pain in right shoulder: Secondary | ICD-10-CM | POA: Diagnosis not present

## 2024-05-24 DIAGNOSIS — G8929 Other chronic pain: Secondary | ICD-10-CM | POA: Diagnosis not present

## 2024-05-26 DIAGNOSIS — Z4789 Encounter for other orthopedic aftercare: Secondary | ICD-10-CM | POA: Diagnosis not present

## 2024-05-26 DIAGNOSIS — M25511 Pain in right shoulder: Secondary | ICD-10-CM | POA: Diagnosis not present

## 2024-05-26 DIAGNOSIS — G8929 Other chronic pain: Secondary | ICD-10-CM | POA: Diagnosis not present

## 2024-05-31 DIAGNOSIS — Z4789 Encounter for other orthopedic aftercare: Secondary | ICD-10-CM | POA: Diagnosis not present

## 2024-05-31 DIAGNOSIS — G8929 Other chronic pain: Secondary | ICD-10-CM | POA: Diagnosis not present

## 2024-05-31 DIAGNOSIS — M25511 Pain in right shoulder: Secondary | ICD-10-CM | POA: Diagnosis not present

## 2024-06-02 DIAGNOSIS — M25511 Pain in right shoulder: Secondary | ICD-10-CM | POA: Diagnosis not present

## 2024-06-02 DIAGNOSIS — Z4789 Encounter for other orthopedic aftercare: Secondary | ICD-10-CM | POA: Diagnosis not present

## 2024-06-02 DIAGNOSIS — G8929 Other chronic pain: Secondary | ICD-10-CM | POA: Diagnosis not present

## 2024-06-07 DIAGNOSIS — Z4789 Encounter for other orthopedic aftercare: Secondary | ICD-10-CM | POA: Diagnosis not present

## 2024-06-07 DIAGNOSIS — G8929 Other chronic pain: Secondary | ICD-10-CM | POA: Diagnosis not present

## 2024-06-07 DIAGNOSIS — M25511 Pain in right shoulder: Secondary | ICD-10-CM | POA: Diagnosis not present

## 2024-06-09 DIAGNOSIS — G8929 Other chronic pain: Secondary | ICD-10-CM | POA: Diagnosis not present

## 2024-06-09 DIAGNOSIS — Z4789 Encounter for other orthopedic aftercare: Secondary | ICD-10-CM | POA: Diagnosis not present

## 2024-06-09 DIAGNOSIS — M25511 Pain in right shoulder: Secondary | ICD-10-CM | POA: Diagnosis not present

## 2024-06-14 DIAGNOSIS — L209 Atopic dermatitis, unspecified: Secondary | ICD-10-CM | POA: Diagnosis not present

## 2024-06-16 DIAGNOSIS — B86 Scabies: Secondary | ICD-10-CM | POA: Diagnosis not present

## 2024-06-16 DIAGNOSIS — L501 Idiopathic urticaria: Secondary | ICD-10-CM | POA: Diagnosis not present

## 2024-06-16 DIAGNOSIS — Z4789 Encounter for other orthopedic aftercare: Secondary | ICD-10-CM | POA: Diagnosis not present

## 2024-06-16 DIAGNOSIS — M25511 Pain in right shoulder: Secondary | ICD-10-CM | POA: Diagnosis not present

## 2024-06-16 DIAGNOSIS — L209 Atopic dermatitis, unspecified: Secondary | ICD-10-CM | POA: Diagnosis not present

## 2024-06-16 DIAGNOSIS — L299 Pruritus, unspecified: Secondary | ICD-10-CM | POA: Diagnosis not present

## 2024-06-16 DIAGNOSIS — G8929 Other chronic pain: Secondary | ICD-10-CM | POA: Diagnosis not present

## 2024-06-23 DIAGNOSIS — L209 Atopic dermatitis, unspecified: Secondary | ICD-10-CM | POA: Diagnosis not present

## 2024-06-23 DIAGNOSIS — L299 Pruritus, unspecified: Secondary | ICD-10-CM | POA: Diagnosis not present

## 2024-06-27 DIAGNOSIS — M25811 Other specified joint disorders, right shoulder: Secondary | ICD-10-CM | POA: Diagnosis not present

## 2024-07-04 DIAGNOSIS — L209 Atopic dermatitis, unspecified: Secondary | ICD-10-CM | POA: Diagnosis not present

## 2024-07-07 DIAGNOSIS — M546 Pain in thoracic spine: Secondary | ICD-10-CM | POA: Diagnosis not present

## 2024-07-11 DIAGNOSIS — M542 Cervicalgia: Secondary | ICD-10-CM | POA: Diagnosis not present

## 2024-07-21 DIAGNOSIS — M5412 Radiculopathy, cervical region: Secondary | ICD-10-CM | POA: Diagnosis not present

## 2024-07-28 DIAGNOSIS — M542 Cervicalgia: Secondary | ICD-10-CM | POA: Diagnosis not present

## 2024-08-03 DIAGNOSIS — L309 Dermatitis, unspecified: Secondary | ICD-10-CM | POA: Diagnosis not present

## 2024-08-04 DIAGNOSIS — M5412 Radiculopathy, cervical region: Secondary | ICD-10-CM | POA: Diagnosis not present

## 2024-08-28 DIAGNOSIS — M5412 Radiculopathy, cervical region: Secondary | ICD-10-CM | POA: Diagnosis not present

## 2024-09-05 DIAGNOSIS — L2089 Other atopic dermatitis: Secondary | ICD-10-CM | POA: Diagnosis not present

## 2024-09-21 DIAGNOSIS — M25811 Other specified joint disorders, right shoulder: Secondary | ICD-10-CM | POA: Diagnosis not present

## 2024-09-26 DIAGNOSIS — M5412 Radiculopathy, cervical region: Secondary | ICD-10-CM | POA: Diagnosis not present

## 2024-10-09 DIAGNOSIS — L309 Dermatitis, unspecified: Secondary | ICD-10-CM | POA: Diagnosis not present

## 2024-10-09 DIAGNOSIS — L308 Other specified dermatitis: Secondary | ICD-10-CM | POA: Diagnosis not present

## 2024-10-10 DIAGNOSIS — H18413 Arcus senilis, bilateral: Secondary | ICD-10-CM | POA: Diagnosis not present

## 2024-10-10 DIAGNOSIS — H2512 Age-related nuclear cataract, left eye: Secondary | ICD-10-CM | POA: Diagnosis not present

## 2024-10-10 DIAGNOSIS — H25013 Cortical age-related cataract, bilateral: Secondary | ICD-10-CM | POA: Diagnosis not present

## 2024-10-10 DIAGNOSIS — H2513 Age-related nuclear cataract, bilateral: Secondary | ICD-10-CM | POA: Diagnosis not present

## 2024-10-10 DIAGNOSIS — H25043 Posterior subcapsular polar age-related cataract, bilateral: Secondary | ICD-10-CM | POA: Diagnosis not present

## 2024-10-23 DIAGNOSIS — M5412 Radiculopathy, cervical region: Secondary | ICD-10-CM | POA: Diagnosis not present

## 2024-11-03 DIAGNOSIS — R590 Localized enlarged lymph nodes: Secondary | ICD-10-CM | POA: Diagnosis not present

## 2024-11-20 ENCOUNTER — Ambulatory Visit: Payer: Self-pay | Admitting: Surgery

## 2024-11-20 DIAGNOSIS — R590 Localized enlarged lymph nodes: Secondary | ICD-10-CM

## 2024-12-06 ENCOUNTER — Other Ambulatory Visit: Payer: Self-pay

## 2024-12-06 ENCOUNTER — Encounter (HOSPITAL_BASED_OUTPATIENT_CLINIC_OR_DEPARTMENT_OTHER): Payer: Self-pay | Admitting: Surgery

## 2024-12-06 NOTE — Progress Notes (Signed)
" °   12/06/24 1503  PAT Phone Screen  Is the patient taking a GLP-1 receptor agonist? No  Do You Have Diabetes? No  Do You Have Hypertension? (S)  Yes  Have You Ever Been to the ER for Asthma? No  Have You Taken Oral Steroids in the Past 3 Months? No  Do you Take Phenteramine or any Other Diet Drugs? No  Recent  Lab Work, EKG, CXR? No  Do you have a history of heart problems? No  Any Recent Hospitalizations? No  Height 5' 2 (1.575 m)  Weight 87.1 kg  Pat Appointment Scheduled (S)  Yes (BMP, EKG)    "

## 2024-12-08 ENCOUNTER — Encounter (HOSPITAL_BASED_OUTPATIENT_CLINIC_OR_DEPARTMENT_OTHER)
Admission: RE | Admit: 2024-12-08 | Discharge: 2024-12-08 | Disposition: A | Source: Ambulatory Visit | Attending: Surgery

## 2024-12-08 DIAGNOSIS — Z01810 Encounter for preprocedural cardiovascular examination: Secondary | ICD-10-CM | POA: Diagnosis present

## 2024-12-08 DIAGNOSIS — R000 Tachycardia, unspecified: Secondary | ICD-10-CM | POA: Diagnosis not present

## 2024-12-08 DIAGNOSIS — I10 Essential (primary) hypertension: Secondary | ICD-10-CM | POA: Diagnosis not present

## 2024-12-08 DIAGNOSIS — R9431 Abnormal electrocardiogram [ECG] [EKG]: Secondary | ICD-10-CM | POA: Insufficient documentation

## 2024-12-08 MED ORDER — CHLORHEXIDINE GLUCONATE CLOTH 2 % EX PADS: 6.0000 | MEDICATED_PAD | Freq: Once | CUTANEOUS | Status: AC

## 2024-12-08 NOTE — Progress Notes (Signed)
 EKG reviewed by Dr. Roberto, patient will need to follow up with PCP to address sinus tachycardia, per Dr. Roberto. Notified patient and Sari at Dr. Milta office.

## 2024-12-08 NOTE — Progress Notes (Signed)

## 2024-12-12 ENCOUNTER — Other Ambulatory Visit: Payer: Self-pay | Admitting: Family Medicine

## 2024-12-12 ENCOUNTER — Ambulatory Visit
Admission: RE | Admit: 2024-12-12 | Discharge: 2024-12-12 | Disposition: A | Source: Ambulatory Visit | Attending: Family Medicine | Admitting: Family Medicine

## 2024-12-12 DIAGNOSIS — R000 Tachycardia, unspecified: Secondary | ICD-10-CM

## 2024-12-13 ENCOUNTER — Other Ambulatory Visit: Payer: Self-pay | Admitting: Family Medicine

## 2024-12-13 DIAGNOSIS — R000 Tachycardia, unspecified: Secondary | ICD-10-CM

## 2024-12-14 ENCOUNTER — Ambulatory Visit
Admission: RE | Admit: 2024-12-14 | Discharge: 2024-12-14 | Disposition: A | Discharge status: Home / Self Care | Source: Ambulatory Visit | Attending: Family Medicine | Admitting: Family Medicine

## 2024-12-14 DIAGNOSIS — I10 Essential (primary) hypertension: Secondary | ICD-10-CM

## 2024-12-14 DIAGNOSIS — R590 Localized enlarged lymph nodes: Secondary | ICD-10-CM

## 2024-12-14 DIAGNOSIS — R000 Tachycardia, unspecified: Secondary | ICD-10-CM

## 2024-12-14 MED ORDER — IOPAMIDOL (ISOVUE-300) INJECTION 61%
80.0000 mL | Freq: Once | INTRAVENOUS | Status: AC | PRN
  Administered 2024-12-14: 80 mL via INTRAVENOUS

## 2025-01-02 ENCOUNTER — Ambulatory Visit

## 2025-01-16 ENCOUNTER — Ambulatory Visit (HOSPITAL_BASED_OUTPATIENT_CLINIC_OR_DEPARTMENT_OTHER): Admission: RE | Admit: 2025-01-16 | Source: Home / Self Care | Admitting: Surgery

## 2025-01-16 ENCOUNTER — Encounter (HOSPITAL_BASED_OUTPATIENT_CLINIC_OR_DEPARTMENT_OTHER): Payer: Self-pay | Admitting: Anesthesiology

## 2025-01-16 ENCOUNTER — Encounter (HOSPITAL_BASED_OUTPATIENT_CLINIC_OR_DEPARTMENT_OTHER): Admission: RE | Payer: Self-pay | Source: Home / Self Care

## 2025-01-16 DIAGNOSIS — R590 Localized enlarged lymph nodes: Secondary | ICD-10-CM

## 2025-01-16 DIAGNOSIS — I10 Essential (primary) hypertension: Secondary | ICD-10-CM
